# Patient Record
Sex: Female | Born: 1960
Health system: Southern US, Community
[De-identification: ages and names within clinical notes are randomized; demographics above are authoritative.]

## PROBLEM LIST (undated history)

## (undated) DIAGNOSIS — T7840XA Allergy, unspecified, initial encounter: Secondary | ICD-10-CM

## (undated) HISTORY — PX: CRYOABLATION: SHX1415

## (undated) HISTORY — DX: Allergy, unspecified, initial encounter: T78.40XA

---

## 2018-02-27 ENCOUNTER — Emergency Department (HOSPITAL_COMMUNITY): Payer: Self-pay

## 2018-02-27 ENCOUNTER — Encounter (HOSPITAL_COMMUNITY): Payer: Self-pay

## 2018-02-27 ENCOUNTER — Other Ambulatory Visit: Payer: Self-pay

## 2018-02-27 ENCOUNTER — Inpatient Hospital Stay (HOSPITAL_COMMUNITY)
Admission: EM | Admit: 2018-02-27 | Discharge: 2018-03-01 | DRG: 494 | Disposition: A | Discharge status: Home Health | Payer: Self-pay | Attending: Orthopedic Surgery | Admitting: Orthopedic Surgery

## 2018-02-27 ENCOUNTER — Inpatient Hospital Stay (HOSPITAL_COMMUNITY): Payer: Self-pay

## 2018-02-27 ENCOUNTER — Telehealth: Payer: Self-pay | Admitting: Radiology

## 2018-02-27 DIAGNOSIS — Z9889 Other specified postprocedural states: Secondary | ICD-10-CM

## 2018-02-27 DIAGNOSIS — S82852A Displaced trimalleolar fracture of left lower leg, initial encounter for closed fracture: Principal | ICD-10-CM | POA: Diagnosis present

## 2018-02-27 DIAGNOSIS — F17210 Nicotine dependence, cigarettes, uncomplicated: Secondary | ICD-10-CM | POA: Diagnosis present

## 2018-02-27 DIAGNOSIS — W109XXA Fall (on) (from) unspecified stairs and steps, initial encounter: Secondary | ICD-10-CM | POA: Diagnosis present

## 2018-02-27 DIAGNOSIS — Z01818 Encounter for other preprocedural examination: Secondary | ICD-10-CM

## 2018-02-27 LAB — CBC WITH DIFFERENTIAL/PLATELET
BASOS ABS: 0 10*3/uL (ref 0.0–0.1)
BASOS PCT: 0 %
EOS ABS: 0.1 10*3/uL (ref 0.0–0.7)
EOS PCT: 1 %
HCT: 39.6 % (ref 36.0–46.0)
HEMOGLOBIN: 13.6 g/dL (ref 12.0–15.0)
LYMPHS ABS: 1.6 10*3/uL (ref 0.7–4.0)
Lymphocytes Relative: 22 %
MCH: 32.5 pg (ref 26.0–34.0)
MCHC: 34.3 g/dL (ref 30.0–36.0)
MCV: 94.5 fL (ref 78.0–100.0)
Monocytes Absolute: 0.5 10*3/uL (ref 0.1–1.0)
Monocytes Relative: 6 %
NEUTROS PCT: 71 %
Neutro Abs: 5.1 10*3/uL (ref 1.7–7.7)
PLATELETS: 205 10*3/uL (ref 150–400)
RBC: 4.19 MIL/uL (ref 3.87–5.11)
RDW: 12.5 % (ref 11.5–15.5)
WBC: 7.2 10*3/uL (ref 4.0–10.5)

## 2018-02-27 LAB — BASIC METABOLIC PANEL
ANION GAP: 12 (ref 5–15)
BUN: 11 mg/dL (ref 6–20)
CHLORIDE: 101 mmol/L (ref 101–111)
CO2: 26 mmol/L (ref 22–32)
Calcium: 9.2 mg/dL (ref 8.9–10.3)
Creatinine, Ser: 0.64 mg/dL (ref 0.44–1.00)
Glucose, Bld: 116 mg/dL — ABNORMAL HIGH (ref 65–99)
POTASSIUM: 3.6 mmol/L (ref 3.5–5.1)
SODIUM: 139 mmol/L (ref 135–145)

## 2018-02-27 LAB — SURGICAL PCR SCREEN
MRSA, PCR: NEGATIVE
STAPHYLOCOCCUS AUREUS: NEGATIVE

## 2018-02-27 MED ORDER — HYDROMORPHONE HCL 1 MG/ML IJ SOLN
0.5000 mg | Freq: Once | INTRAMUSCULAR | Status: AC
  Administered 2018-02-27: 0.5 mg via INTRAVENOUS
  Filled 2018-02-27: qty 1

## 2018-02-27 MED ORDER — HYDROCODONE-ACETAMINOPHEN 5-325 MG PO TABS
1.0000 | ORAL_TABLET | ORAL | Status: DC | PRN
  Administered 2018-02-27 – 2018-02-28 (×2): 1 via ORAL
  Filled 2018-02-27 (×2): qty 1

## 2018-02-27 MED ORDER — PROPOFOL 10 MG/ML IV BOLUS
100.0000 mg | Freq: Once | INTRAVENOUS | Status: AC
  Administered 2018-02-27: 40 mg via INTRAVENOUS
  Filled 2018-02-27: qty 20

## 2018-02-27 MED ORDER — SALINE SPRAY 0.65 % NA SOLN: 1.0000 | NASAL | Status: DC | PRN

## 2018-02-27 MED ORDER — SODIUM CHLORIDE 0.9 % IV SOLN
INTRAVENOUS | Status: DC
  Administered 2018-02-27 – 2018-03-01 (×4): via INTRAVENOUS

## 2018-02-27 MED ORDER — ONDANSETRON HCL 4 MG/2ML IJ SOLN
4.0000 mg | Freq: Four times a day (QID) | INTRAMUSCULAR | Status: DC | PRN
  Administered 2018-02-27: 4 mg via INTRAVENOUS
  Filled 2018-02-27: qty 2

## 2018-02-27 MED ORDER — CHLORHEXIDINE GLUCONATE 4 % EX LIQD: 60.0000 mL | Freq: Once | CUTANEOUS | Status: DC

## 2018-02-27 MED ORDER — CHLORHEXIDINE GLUCONATE 4 % EX LIQD
60.0000 mL | Freq: Once | CUTANEOUS | Status: AC
  Administered 2018-02-28: 4 via TOPICAL
  Filled 2018-02-27: qty 60

## 2018-02-27 MED ORDER — HYDROMORPHONE HCL 1 MG/ML IJ SOLN: 0.5000 mg | INTRAMUSCULAR | Status: DC | PRN

## 2018-02-27 MED ORDER — PROPOFOL 10 MG/ML IV BOLUS
INTRAVENOUS | Status: AC | PRN
  Administered 2018-02-27: 20 mg via INTRAVENOUS
  Administered 2018-02-27 (×2): 40 mg via INTRAVENOUS

## 2018-02-27 MED ORDER — HYDROMORPHONE HCL 1 MG/ML IJ SOLN
0.5000 mg | INTRAMUSCULAR | Status: DC | PRN
  Administered 2018-02-27: 1 mg via INTRAVENOUS
  Administered 2018-02-27 (×3): 0.5 mg via INTRAVENOUS
  Administered 2018-02-28 (×2): 1 mg via INTRAVENOUS
  Filled 2018-02-27 (×3): qty 1
  Filled 2018-02-27 (×3): qty 0.5

## 2018-02-27 MED ORDER — SALINE SPRAY 0.65 % NA SOLN
1.0000 | NASAL | Status: DC | PRN
  Administered 2018-02-27: 1 via NASAL
  Filled 2018-02-27: qty 44

## 2018-02-27 NOTE — ED Provider Notes (Signed)
[facility] EMERGENCY DEPARTMENT Provider Note   CSN: [id number] Arrival date & time: 02/27/18  4098     History   Chief Complaint Chief Complaint  Patient presents with  . Ankle Pain    HPI Courtney Fitzgerald is a 57 y.o. female.  HPI Patient was in normal state of health when she woke this morning.  She was going down some stairs at her house and missed the last 2 steps.  She inverted her left ankle and heard a pop.  She then fell onto her right side.  She denies hitting her head.  No loss of consciousness.  Unable to bear weight.  Currently complains of left ankle, left knee and left hip pain.  EMS transported to the emergency department.  Patient was given 75 mcg of fentanyl en route. History reviewed. No pertinent past medical history.  Patient Active Problem List   Diagnosis Date Noted  . Trimalleolar fracture of ankle, closed, left, initial encounter 02/27/2018    History reviewed. No pertinent surgical history.   OB History   None      Home Medications    Prior to Admission medications   Medication Sig Start Date End Date Taking? Authorizing Provider  sodium chloride (OCEAN) 0.65 % SOLN nasal spray Place 1 spray into both nostrils as needed for congestion.   Yes [provider]    Family History No family history on file.  Social History Social History   Tobacco Use  . Smoking status: Current Every Day Smoker    Packs/day: 1.00  Substance Use Topics  . Alcohol use: Yes    Comment: daily  . Drug use: Not Currently     Allergies   Patient has no known allergies.   Review of Systems Review of Systems  Musculoskeletal: Positive for arthralgias. Negative for back pain and neck pain.  Skin: Negative for wound.  Neurological: Negative for syncope, weakness, numbness and headaches.  All other systems reviewed and are negative.    Physical Exam Updated Vital Signs BP (!) 184/91   Pulse 83   Temp 97.8 F (36.6 C) (Oral)   Resp 19   Ht 5\' 7"   (1.702 m)   Wt 93 kg (205 lb)   SpO2 96%   BMI 32.11 kg/m   Physical Exam  Constitutional: She is oriented to person, place, and time. She appears well-developed and well-nourished.  HENT:  Head: Normocephalic and atraumatic.  Mouth/Throat: Oropharynx is clear and moist.  No obvious scalp injury.  Midface is stable.  No malocclusion.  Eyes: Pupils are equal, round, and reactive to light. EOM are normal.  Neck: Normal range of motion. Neck supple.  No posterior midline cervical tenderness to palpation.  Cardiovascular: Normal rate and regular rhythm.  Pulmonary/Chest: Effort normal and breath sounds normal.  Abdominal: Soft. Bowel sounds are normal. There is no tenderness. There is no rebound and no guarding.  Musculoskeletal: Normal range of motion. She exhibits no edema or tenderness.  Pelvis is stable.  She does have some range of pain with range of motion of the left hip.  Tenderness to palpation over the lateral posterior left knee.  No obvious deformity or effusion.  Limited range of motion due to pain.  Left ankle is diffusely swollen and tender.  Limited range of motion due to pain.  Distal pulses are 2+.  Good distal cap refill.  Neurological: She is alert and oriented to person, place, and time.  Distal sensation to light touch intact.  Patient able to wiggle toes without deficit.  Skin: Skin is warm and dry. Capillary refill takes less than 2 seconds. No rash noted. No erythema.  Psychiatric: She has a normal mood and affect. Her behavior is normal.  Nursing note and vitals reviewed.    ED Treatments / Results  Labs (all labs ordered are listed, but only abnormal results are displayed) Labs Reviewed - No data to display  EKG None  Radiology Dg Ankle Complete Left  Result Date: 02/27/2018 CLINICAL DATA:  Post reduction of left ankle fracture. EXAM: LEFT ANKLE COMPLETE - 3+ VIEW COMPARISON:  Left ankle radiographs of the same day at 7:22 a.m. FINDINGS: Bimalleolar  fractures are in a similar configuration to previous study. The posterior tibial fracture demonstrates slight increased displacement. A fiberglass splint is now in place. No new fractures are present. IMPRESSION: 1. Interval placement of fiberglass splint without significant reduction in the fractures. Electronically Signed   By: Marin Roberts M.D.   On: 02/27/2018 09:46   Dg Ankle Complete Left  Result Date: 02/27/2018 CLINICAL DATA:  Pain following fall EXAM: LEFT ANKLE COMPLETE - 3+ VIEW COMPARISON:  None. FINDINGS: Frontal, oblique, and lateral views were obtained. There is a transversely oriented fracture of the medial malleolus with medial malleolus displaced laterally with respect the remainder the tibia. There is a comminuted fracture of the distal fibular diaphysis with lateral displacement and anterior angulation distally. There is a fracture of the posterior tibial metaphysis with alignment near anatomic. There is ankle mortise disruption. There are spurs arising from the posterior inferior calcaneus. No appreciable joint space narrowing. IMPRESSION: Fractures of the medial malleolus, distal fibula, and posterior tibial metaphysis consistent with trimalleolar type fracture. Ankle mortise disruption noted. No appreciable joint space narrowing.  There are calcaneal spurs. Electronically Signed   By: Bretta Bang III M.D.   On: 02/27/2018 08:17   Dg Knee Complete 4 Views Left  Result Date: 02/27/2018 CLINICAL DATA:  Pain following fall EXAM: LEFT KNEE - COMPLETE 4+ VIEW COMPARISON:  None. FINDINGS: Frontal, lateral, and bilateral oblique views were obtained. There is no evident fracture or dislocation. No joint effusion. Joint spaces appear normal. No erosive change. IMPRESSION: No fracture or joint effusion.  No evident arthropathy. Electronically Signed   By: Bretta Bang III M.D.   On: 02/27/2018 08:17   Dg Hip Unilat W Or Wo Pelvis 2-3 Views Left  Result Date:  02/27/2018 CLINICAL DATA:  Pain following fall EXAM: DG HIP (WITH OR WITHOUT PELVIS) 2-3V LEFT COMPARISON:  None. FINDINGS: Frontal pelvis as well as frontal and lateral left hip images were obtained. There is no fracture or dislocation. Joint spaces appear normal. No erosive change. IMPRESSION: No fracture or dislocation.  No evident arthropathy. Electronically Signed   By: Bretta Bang III M.D.   On: 02/27/2018 08:15    Procedures Reduction of fracture Date/Time: 02/27/2018 8:42 AM Performed by: Loren Racer, MD Authorized by: Loren Racer, MD  Consent: Written consent obtained. Risks and benefits: risks, benefits and alternatives were discussed Consent given by: patient Patient understanding: patient states understanding of the procedure being performed Patient consent: the patient's understanding of the procedure matches consent given Procedure consent: procedure consent matches procedure scheduled Relevant documents: relevant documents present and verified Imaging studies: imaging studies available Patient identity confirmed: verbally with patient and arm band Time out: Immediately prior to procedure a "time out" was called to verify the correct patient, procedure, equipment, support staff and site/side marked as required.  Local anesthesia used: no  Anesthesia: Local anesthesia used: no  Sedation: Patient sedated: yes Sedatives: propofol Sedation start date/time: 02/27/2018 8:42 AM Sedation end date/time: 02/27/2018 8:55 AM Vitals: Vital signs were monitored during sedation.  Patient tolerance: Patient tolerated the procedure well with no immediate complications  .Splint Application Date/Time: 02/27/2018 8:58 AM Performed by: Loren RacerYelverton, Delford Wingert, MD Authorized by: Loren RacerYelverton, Haik Mahoney, MD   Consent:    Consent obtained:  Written   Consent given by:  Patient   Risks discussed:  Numbness, pain and swelling Pre-procedure details:    Sensation:  Normal Procedure details:     Laterality:  Left   Location:  Leg   Leg:  L lower leg   Splint type:  Short leg and ankle stirrup   Supplies:  Prefabricated splint Post-procedure details:    Sensation:  Normal   Patient tolerance of procedure:  Tolerated well, no immediate complications .Sedation Date/Time: 02/27/2018 8:42 AM Performed by: Loren RacerYelverton, Dalis Beers, MD Authorized by: Loren RacerYelverton, Brittni Hult, MD   Consent:    Consent obtained:  Written   Consent given by:  Patient   Risks discussed:  Prolonged sedation necessitating reversal, prolonged hypoxia resulting in organ damage, respiratory compromise necessitating ventilatory assistance and intubation, vomiting, nausea, inadequate sedation and allergic reaction Indications:    Procedure performed:  Fracture reduction   Procedure necessitating sedation performed by:  Physician performing sedation   Intended level of sedation:  Moderate (conscious sedation) Pre-sedation assessment:    Time since last food or drink:  11 hours   ASA classification: class 1 - normal, healthy patient     Neck mobility: normal     Mouth opening:  3 or more finger widths   Mallampati score:  I - soft palate, uvula, fauces, pillars visible   Pre-sedation assessments completed and reviewed: airway patency, cardiovascular function, hydration status, mental status, nausea/vomiting, pain level and respiratory function     Pre-sedation assessment completed:  02/27/2018 9:40 AM Immediate pre-procedure details:    Reassessment: Patient reassessed immediately prior to procedure     Reviewed: vital signs     Verified: bag valve mask available, emergency equipment available, intubation equipment available, oxygen available and suction available   Procedure details (see MAR for exact dosages):    Preoxygenation:  Nasal cannula   Sedation:  Propofol   Intra-procedure monitoring:  Blood pressure monitoring, cardiac monitor, continuous pulse oximetry, frequent LOC assessments and frequent vital sign checks    Intra-procedure events: none     Total Provider sedation time (minutes):  13 Post-procedure details:    Post-sedation assessment completed:  02/27/2018 9:02 AM   Attendance: Constant attendance by certified staff until patient recovered     Recovery: Patient returned to pre-procedure baseline     Post-sedation assessments completed and reviewed: airway patency, cardiovascular function, hydration status, mental status, nausea/vomiting, pain level and respiratory function     Patient tolerance:  Tolerated well, no immediate complications   (including critical care time)  Medications Ordered in ED Medications  HYDROmorphone (DILAUDID) injection 0.5 mg (has no administration in time range)  HYDROmorphone (DILAUDID) injection 0.5 mg (has no administration in time range)  propofol (DIPRIVAN) 10 mg/mL bolus/IV push 100 mg (40 mg Intravenous Given 02/27/18 0843)  propofol (DIPRIVAN) 10 mg/mL bolus/IV push (20 mg Intravenous Given 02/27/18 0852)     Initial Impression / Assessment and Plan / ED Course  I have reviewed the triage vital signs and the nursing notes.  Pertinent labs & imaging results that were available  during my care of the patient were reviewed by me and considered in my medical decision making (see chart for details).     Patient with trimalleolar fracture of the left ankle.  Procedural sedation performed in the emergency department with reduction and splint application.  For reduction of dry mouth fracture.  Discussed with Dr. Romeo Apple.  Will admit to MedSurg bed. Final Clinical Impressions(s) / ED Diagnoses   Final diagnoses:  Closed trimalleolar fracture of left ankle, initial encounter    ED Discharge Orders    None       Loren Racer, MD 02/27/18 1029

## 2018-02-27 NOTE — ED Triage Notes (Addendum)
Pt reports that she was going down steps this morning and missed 2 steps . Pt reports she heard left ankle snap. Pt given fentanyl 75 mcg IV. Noted deformity. Foot warm, pulse palpable

## 2018-02-27 NOTE — Sedation Documentation (Signed)
MD wrapping pt's ankle

## 2018-02-27 NOTE — H&P (Signed)
Courtney Fitzgerald is an 57 y.o. female.   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  Closed reduction was attempted unsuccessful patient was admitted for definitive management  Complains of moderate to severe pain only on the left ankle complains of some throbbing aching in the left ankle. Cant bear weight   Denies any other joint pain  No history of hypertension or diabetes  No other surgery only anesthetic was for birth of 2 children  History reviewed. No pertinent past medical history.  History reviewed. No pertinent surgical history.  History reviewed. No pertinent family history. Social History:  reports that she has been smoking.  She has been smoking about 1.00 pack per day. She uses smokeless tobacco. She reports that she drinks alcohol. She reports that she has current or past drug history.  Allergies: No Known Allergies  Medications Prior to Admission  Medication Sig Dispense Refill  . sodium chloride (OCEAN) 0.65 % SOLN nasal spray Place 1 spray into both nostrils as needed for congestion.      No results found for this or any previous visit (from the past 48 hour(s)). Dg Ankle Complete Left  Result Date: 02/27/2018 CLINICAL DATA:  Post reduction of left ankle fracture. EXAM: LEFT ANKLE COMPLETE - 3+ VIEW COMPARISON:  Left ankle radiographs of the same day at 7:22 a.m. FINDINGS: Bimalleolar fractures are in a similar configuration to previous study. The posterior tibial fracture demonstrates slight increased displacement. A fiberglass splint is now in place. No new fractures are present. IMPRESSION: 1. Interval placement of fiberglass splint without significant reduction in the fractures. Electronically Signed   By: Marin Roberts M.D.   On: 02/27/2018 09:46   Dg Ankle Complete Left  Result Date: 02/27/2018 CLINICAL DATA:  Pain following fall  EXAM: LEFT ANKLE COMPLETE - 3+ VIEW COMPARISON:  None. FINDINGS: Frontal, oblique, and lateral views were obtained. There is a transversely oriented fracture of the medial malleolus with medial malleolus displaced laterally with respect the remainder the tibia. There is a comminuted fracture of the distal fibular diaphysis with lateral displacement and anterior angulation distally. There is a fracture of the posterior tibial metaphysis with alignment near anatomic. There is ankle mortise disruption. There are spurs arising from the posterior inferior calcaneus. No appreciable joint space narrowing. IMPRESSION: Fractures of the medial malleolus, distal fibula, and posterior tibial metaphysis consistent with trimalleolar type fracture. Ankle mortise disruption noted. No appreciable joint space narrowing.  There are calcaneal spurs. Electronically Signed   By: Bretta Bang III M.D.   On: 02/27/2018 08:17   Dg Knee Complete 4 Views Left  Result Date: 02/27/2018 CLINICAL DATA:  Pain following fall EXAM: LEFT KNEE - COMPLETE 4+ VIEW COMPARISON:  None. FINDINGS: Frontal, lateral, and bilateral oblique views were obtained. There is no evident fracture or dislocation. No joint effusion. Joint spaces appear normal. No erosive change. IMPRESSION: No fracture or joint effusion.  No evident arthropathy. Electronically Signed   By: Bretta Bang III M.D.   On: 02/27/2018 08:17   Dg Hip Unilat W Or Wo Pelvis 2-3 Views Left  Result Date: 02/27/2018 CLINICAL DATA:  Pain following fall EXAM: DG HIP (WITH OR WITHOUT PELVIS) 2-3V LEFT COMPARISON:  None. FINDINGS: Frontal pelvis as well as frontal and lateral left hip images were obtained. There is no fracture or dislocation. Joint spaces appear normal. No erosive change.  IMPRESSION: No fracture or dislocation.  No evident arthropathy. Electronically Signed   By: Bretta BangWilliam  Woodruff III M.D.   On: 02/27/2018 08:15    Review of Systems  Respiratory: Negative for  shortness of breath.   All other systems reviewed and are negative.   Blood pressure (!) 173/88, pulse 72, temperature 97.9 F (36.6 C), temperature source Oral, resp. rate 18, height 5\' 7"  (1.702 m), weight 192 lb 10.9 oz (87.4 kg), SpO2 98 %. Physical Exam  Nursing note and vitals reviewed. Constitutional: She is oriented to person, place, and time. She appears well-nourished.  Eyes: Right eye exhibits no discharge. Left eye exhibits no discharge. No scleral icterus.  Neck: Neck supple. No JVD present. No tracheal deviation present.  Cardiovascular: Intact distal pulses.  Respiratory: Effort normal. No stridor.  GI: Soft. She exhibits no distension.  Musculoskeletal:       Arms:      Legs: Neurological: She is alert and oriented to person, place, and time. She has normal reflexes. She exhibits normal muscle tone. Coordination normal.  Skin: Skin is warm and dry. No rash noted. No erythema. No pallor.  Psychiatric: She has a normal mood and affect. Her behavior is normal. Thought content normal.     Assessment/Plan X-ray shows a comminuted fracture of the lateral malleolus transverse fracture of the medial malleolus posterior malleolus fracture approximately 10% of the joint surface  The procedure has been fully reviewed with the patient; The risks and benefits of surgery have been discussed and explained and understood. Alternative treatment has also been reviewed, questions were encouraged and answered. The postoperative plan is also been reviewed.  The nature of the problems regarding the left ankle fracture been discussed including the postoperative risks and benefits of surgery including but not limited to stiffness of the ankle, subsequent hardware removal, infection, blood clot,  Patient has been warned about the smoking effect on wound healing and bone healing  Plan is to perform open treatment internal fixation left ankle with plate and screws    Fuller CanadaStanley Mateo Overbeck,  MD 02/27/2018, 1:06 PM

## 2018-02-27 NOTE — ED Notes (Signed)
Patient transported to X-ray 

## 2018-02-27 NOTE — Telephone Encounter (Signed)
Nurse Leotis ShamesLauren called, wants you to know patient is in Room 306 with ankle fracture.

## 2018-02-28 ENCOUNTER — Encounter (HOSPITAL_COMMUNITY): Payer: Self-pay

## 2018-02-28 ENCOUNTER — Inpatient Hospital Stay (HOSPITAL_COMMUNITY): Payer: Self-pay | Admitting: Anesthesiology

## 2018-02-28 ENCOUNTER — Encounter (HOSPITAL_COMMUNITY)
Admission: EM | Disposition: A | Discharge status: Home Health | Payer: Self-pay | Source: Home / Self Care | Attending: Orthopedic Surgery

## 2018-02-28 ENCOUNTER — Inpatient Hospital Stay (HOSPITAL_COMMUNITY): Payer: Self-pay

## 2018-02-28 HISTORY — PX: ORIF ANKLE FRACTURE: SHX5408

## 2018-02-28 SURGERY — OPEN REDUCTION INTERNAL FIXATION (ORIF) ANKLE FRACTURE
Anesthesia: Regional | Laterality: Left

## 2018-02-28 MED ORDER — ONDANSETRON HCL 4 MG/2ML IJ SOLN
4.0000 mg | Freq: Four times a day (QID) | INTRAMUSCULAR | Status: DC | PRN
Start: 2018-02-28 — End: 2018-03-01

## 2018-02-28 MED ORDER — FENTANYL CITRATE (PF) 100 MCG/2ML IJ SOLN
INTRAMUSCULAR | Status: DC | PRN
  Administered 2018-02-28: 25 ug via INTRAVENOUS

## 2018-02-28 MED ORDER — MEPERIDINE HCL 50 MG/ML IJ SOLN: 6.2500 mg | INTRAMUSCULAR | Status: DC | PRN

## 2018-02-28 MED ORDER — ONDANSETRON HCL 4 MG PO TABS: 4.0000 mg | ORAL_TABLET | Freq: Four times a day (QID) | ORAL | Status: DC | PRN

## 2018-02-28 MED ORDER — TRAMADOL HCL 50 MG PO TABS
50.0000 mg | ORAL_TABLET | Freq: Four times a day (QID) | ORAL | Status: DC
  Administered 2018-02-28 – 2018-03-01 (×4): 50 mg via ORAL
  Filled 2018-02-28 (×4): qty 1

## 2018-02-28 MED ORDER — CEFAZOLIN SODIUM-DEXTROSE 2-4 GM/100ML-% IV SOLN
2.0000 g | INTRAVENOUS | Status: AC
  Administered 2018-02-28: 2 g via INTRAVENOUS

## 2018-02-28 MED ORDER — ONDANSETRON HCL 4 MG/2ML IJ SOLN: 4.0000 mg | Freq: Once | INTRAMUSCULAR | Status: DC | PRN

## 2018-02-28 MED ORDER — CEFAZOLIN SODIUM-DEXTROSE 2-4 GM/100ML-% IV SOLN
2.0000 g | Freq: Four times a day (QID) | INTRAVENOUS | Status: AC
  Administered 2018-02-28 – 2018-03-01 (×2): 2 g via INTRAVENOUS
  Filled 2018-02-28 (×2): qty 100

## 2018-02-28 MED ORDER — EPHEDRINE SULFATE 50 MG/ML IJ SOLN: INTRAMUSCULAR | Status: DC | PRN

## 2018-02-28 MED ORDER — LACTATED RINGERS IV SOLN
INTRAVENOUS | Status: DC | PRN
  Administered 2018-02-28 (×2): via INTRAVENOUS

## 2018-02-28 MED ORDER — 0.9 % SODIUM CHLORIDE (POUR BTL) OPTIME
TOPICAL | Status: DC | PRN
  Administered 2018-02-28 (×2): 1000 mL

## 2018-02-28 MED ORDER — CEFAZOLIN SODIUM-DEXTROSE 2-4 GM/100ML-% IV SOLN
INTRAVENOUS | Status: AC
  Filled 2018-02-28: qty 100

## 2018-02-28 MED ORDER — MENTHOL 3 MG MT LOZG
1.0000 | LOZENGE | OROMUCOSAL | Status: DC | PRN
Start: 2018-02-28 — End: 2018-03-01

## 2018-02-28 MED ORDER — HYDROCODONE-ACETAMINOPHEN 7.5-325 MG PO TABS: 1.0000 | ORAL_TABLET | Freq: Once | ORAL | Status: DC | PRN

## 2018-02-28 MED ORDER — DEXAMETHASONE SODIUM PHOSPHATE 4 MG/ML IJ SOLN
INTRAMUSCULAR | Status: AC
  Filled 2018-02-28: qty 2

## 2018-02-28 MED ORDER — BUPIVACAINE-EPINEPHRINE (PF) 0.5% -1:200000 IJ SOLN
INTRAMUSCULAR | Status: AC
  Filled 2018-02-28: qty 60

## 2018-02-28 MED ORDER — BUPIVACAINE-EPINEPHRINE (PF) 0.5% -1:200000 IJ SOLN
INTRAMUSCULAR | Status: DC | PRN
  Administered 2018-02-28: 45 mL via PERINEURAL

## 2018-02-28 MED ORDER — PROPOFOL 10 MG/ML IV BOLUS
INTRAVENOUS | Status: DC | PRN
  Administered 2018-02-28: 200 mg via INTRAVENOUS

## 2018-02-28 MED ORDER — GABAPENTIN 300 MG PO CAPS
300.0000 mg | ORAL_CAPSULE | Freq: Three times a day (TID) | ORAL | Status: DC
  Administered 2018-02-28 – 2018-03-01 (×2): 300 mg via ORAL
  Filled 2018-02-28 (×2): qty 1

## 2018-02-28 MED ORDER — HYDROMORPHONE HCL 1 MG/ML IJ SOLN: 0.2500 mg | INTRAMUSCULAR | Status: DC | PRN

## 2018-02-28 MED ORDER — METOCLOPRAMIDE HCL 5 MG/ML IJ SOLN: 5.0000 mg | Freq: Three times a day (TID) | INTRAMUSCULAR | Status: DC | PRN

## 2018-02-28 MED ORDER — HYDROCODONE-ACETAMINOPHEN 5-325 MG PO TABS
1.0000 | ORAL_TABLET | ORAL | Status: DC | PRN
  Administered 2018-02-28: 2 via ORAL
  Filled 2018-02-28: qty 2

## 2018-02-28 MED ORDER — PHENOL 1.4 % MT LIQD: 1.0000 | OROMUCOSAL | Status: DC | PRN

## 2018-02-28 MED ORDER — KETOROLAC TROMETHAMINE 15 MG/ML IJ SOLN
7.5000 mg | Freq: Four times a day (QID) | INTRAMUSCULAR | Status: AC
  Administered 2018-02-28 – 2018-03-01 (×4): 7.5 mg via INTRAVENOUS
  Filled 2018-02-28 (×4): qty 1

## 2018-02-28 MED ORDER — FENTANYL CITRATE (PF) 100 MCG/2ML IJ SOLN
INTRAMUSCULAR | Status: AC
  Filled 2018-02-28: qty 4

## 2018-02-28 MED ORDER — ACETAMINOPHEN 325 MG PO TABS: 325.0000 mg | ORAL_TABLET | Freq: Four times a day (QID) | ORAL | Status: DC | PRN

## 2018-02-28 MED ORDER — PROPOFOL 10 MG/ML IV BOLUS
INTRAVENOUS | Status: AC
  Filled 2018-02-28: qty 20

## 2018-02-28 MED ORDER — MIDAZOLAM HCL 5 MG/5ML IJ SOLN
INTRAMUSCULAR | Status: DC | PRN
  Administered 2018-02-28: 1 mg via INTRAVENOUS
  Administered 2018-02-28: 5 mg via INTRAVENOUS

## 2018-02-28 MED ORDER — DOCUSATE SODIUM 100 MG PO CAPS
100.0000 mg | ORAL_CAPSULE | Freq: Two times a day (BID) | ORAL | Status: DC
  Administered 2018-02-28 – 2018-03-01 (×2): 100 mg via ORAL
  Filled 2018-02-28 (×2): qty 1

## 2018-02-28 MED ORDER — POVIDONE-IODINE 10 % EX SWAB: 2.0000 "application " | Freq: Once | CUTANEOUS | Status: DC

## 2018-02-28 MED ORDER — DEXAMETHASONE SODIUM PHOSPHATE 4 MG/ML IJ SOLN
INTRAMUSCULAR | Status: DC | PRN
  Administered 2018-02-28: 8 mg

## 2018-02-28 MED ORDER — ENOXAPARIN SODIUM 30 MG/0.3ML ~~LOC~~ SOLN
30.0000 mg | SUBCUTANEOUS | Status: DC
Start: 2018-03-01 — End: 2018-02-28

## 2018-02-28 MED ORDER — ENOXAPARIN SODIUM 40 MG/0.4ML ~~LOC~~ SOLN
40.0000 mg | SUBCUTANEOUS | Status: DC
  Administered 2018-03-01: 40 mg via SUBCUTANEOUS
  Filled 2018-02-28: qty 0.4

## 2018-02-28 MED ORDER — HYDROCODONE-ACETAMINOPHEN 7.5-325 MG PO TABS: 1.0000 | ORAL_TABLET | ORAL | Status: DC | PRN

## 2018-02-28 MED ORDER — ONDANSETRON HCL 4 MG/2ML IJ SOLN
INTRAMUSCULAR | Status: DC | PRN
  Administered 2018-02-28: 4 mg via INTRAVENOUS

## 2018-02-28 MED ORDER — SODIUM CHLORIDE 0.9 % IJ SOLN
INTRAMUSCULAR | Status: AC
  Filled 2018-02-28: qty 10

## 2018-02-28 MED ORDER — MIDAZOLAM HCL 2 MG/2ML IJ SOLN
INTRAMUSCULAR | Status: AC
  Filled 2018-02-28: qty 6

## 2018-02-28 MED ORDER — SODIUM CHLORIDE 0.9 % IV SOLN
INTRAVENOUS | Status: DC
  Administered 2018-02-28: 18:00:00 via INTRAVENOUS

## 2018-02-28 MED ORDER — MIDAZOLAM HCL 2 MG/2ML IJ SOLN
INTRAMUSCULAR | Status: AC
  Filled 2018-02-28: qty 2

## 2018-02-28 MED ORDER — MORPHINE SULFATE (PF) 2 MG/ML IV SOLN: 0.5000 mg | INTRAVENOUS | Status: DC | PRN

## 2018-02-28 MED ORDER — KETOROLAC TROMETHAMINE 30 MG/ML IJ SOLN: 30.0000 mg | Freq: Once | INTRAMUSCULAR | Status: DC | PRN

## 2018-02-28 MED ORDER — ROPIVACAINE HCL 5 MG/ML IJ SOLN
INTRAMUSCULAR | Status: DC | PRN
  Administered 2018-02-28: 30 mL

## 2018-02-28 MED ORDER — ROPIVACAINE HCL 5 MG/ML IJ SOLN
INTRAMUSCULAR | Status: AC
Start: 2018-02-28 — End: 2018-02-28
  Filled 2018-02-28: qty 30

## 2018-02-28 MED ORDER — METOCLOPRAMIDE HCL 10 MG PO TABS: 5.0000 mg | ORAL_TABLET | Freq: Three times a day (TID) | ORAL | Status: DC | PRN

## 2018-02-28 MED ORDER — CELECOXIB 100 MG PO CAPS
200.0000 mg | ORAL_CAPSULE | Freq: Two times a day (BID) | ORAL | Status: DC
  Administered 2018-02-28 – 2018-03-01 (×2): 200 mg via ORAL
  Filled 2018-02-28 (×2): qty 2

## 2018-02-28 MED ORDER — ONDANSETRON HCL 4 MG/2ML IJ SOLN
INTRAMUSCULAR | Status: AC
  Filled 2018-02-28: qty 2

## 2018-02-28 MED ORDER — LIDOCAINE HCL 1 % IJ SOLN
INTRAMUSCULAR | Status: DC | PRN
  Administered 2018-02-28: 100 mg via INTRADERMAL

## 2018-02-28 SURGICAL SUPPLY — 65 items
1.6 k-wire ×6 IMPLANT
BANDAGE ELASTIC 3 LF NS (GAUZE/BANDAGES/DRESSINGS) ×3 IMPLANT
BANDAGE ELASTIC 4 LF NS (GAUZE/BANDAGES/DRESSINGS) ×6 IMPLANT
BANDAGE ESMARK 4X12 BL STRL LF (DISPOSABLE) ×1 IMPLANT
BIT DRILL 3.5X122MM AO FIT (BIT) IMPLANT
BIT DRILL CANN 2.7 (BIT)
BIT DRILL CANN 2.7MM (BIT)
BIT DRILL SRG 2.7XCANN AO CPLG (BIT) IMPLANT
BIT DRL SRG 2.7XCANN AO CPLNG (BIT)
BLADE SURG SZ10 CARB STEEL (BLADE) ×3 IMPLANT
BNDG COHESIVE 4X5 TAN STRL (GAUZE/BANDAGES/DRESSINGS) ×3 IMPLANT
BNDG ESMARK 4X12 BLUE STRL LF (DISPOSABLE) ×3
CHLORAPREP W/TINT 26ML (MISCELLANEOUS) ×6 IMPLANT
CLOTH BEACON ORANGE TIMEOUT ST (SAFETY) ×3 IMPLANT
COVER LIGHT HANDLE STERIS (MISCELLANEOUS) ×6 IMPLANT
CUFF TOURNIQUET SINGLE 34IN LL (TOURNIQUET CUFF) ×3 IMPLANT
DECANTER SPIKE VIAL GLASS SM (MISCELLANEOUS) ×6 IMPLANT
DRAPE C-ARM FOLDED MOBILE STRL (DRAPES) ×3 IMPLANT
DRAPE PROXIMA HALF (DRAPES) ×3 IMPLANT
DRILL 2.6X122MM WL AO SHAFT (BIT) ×3 IMPLANT
GAUZE SPONGE 4X4 12PLY STRL (GAUZE/BANDAGES/DRESSINGS) ×6 IMPLANT
GAUZE XEROFORM 5X9 LF (GAUZE/BANDAGES/DRESSINGS) ×6 IMPLANT
GLOVE BIOGEL PI IND STRL 7.0 (GLOVE) ×2 IMPLANT
GLOVE BIOGEL PI INDICATOR 7.0 (GLOVE) ×4
GLOVE SKINSENSE NS SZ8.0 LF (GLOVE) ×2
GLOVE SKINSENSE STRL SZ8.0 LF (GLOVE) ×1 IMPLANT
GLOVE SS N UNI LF 8.5 STRL (GLOVE) ×3 IMPLANT
GOWN STRL REUS W/TWL LRG LVL3 (GOWN DISPOSABLE) ×6 IMPLANT
GOWN STRL REUS W/TWL XL LVL3 (GOWN DISPOSABLE) ×3 IMPLANT
INST SET MINOR BONE (KITS) ×3 IMPLANT
K-WIRE 1.4X100 (WIRE)
K-WIRE 1.6X150 (WIRE) ×2
K-WIRE FX150X1.6XKRSH (WIRE) ×2
K-WIRE SMOOTH 2.0X150 (WIRE)
KIT TURNOVER KIT A (KITS) ×3 IMPLANT
KWIRE 1.4X100 (WIRE) IMPLANT
KWIRE FX150X1.6XKRSH (WIRE) ×2 IMPLANT
KWIRE SMOOTH 2.0X150 (WIRE) IMPLANT
MANIFOLD NEPTUNE II (INSTRUMENTS) ×3 IMPLANT
NEEDLE HYPO 21X1.5 SAFETY (NEEDLE) ×3 IMPLANT
NS IRRIG 1000ML POUR BTL (IV SOLUTION) ×3 IMPLANT
PACK BASIC LIMB (CUSTOM PROCEDURE TRAY) ×3 IMPLANT
PAD ABD 5X9 TENDERSORB (GAUZE/BANDAGES/DRESSINGS) ×6 IMPLANT
PAD ARMBOARD 7.5X6 YLW CONV (MISCELLANEOUS) ×3 IMPLANT
PAD CAST 4YDX4 CTTN HI CHSV (CAST SUPPLIES) ×2 IMPLANT
PADDING CAST COTTON 4X4 STRL (CAST SUPPLIES) ×4
PLATE FIBULA 4H (Plate) ×3 IMPLANT
SCREW 3.5X10MM (Screw) ×3 IMPLANT
SCREW BONE 14MMX3.5MM (Screw) ×6 IMPLANT
SCREW BONE 18 (Screw) ×3 IMPLANT
SCREW BONE 3.5X20MM (Screw) ×3 IMPLANT
SCREW BONE NON-LCKING 3.5X12MM (Screw) ×3 IMPLANT
SCREW NONLOCK 22MM (Screw) ×3 IMPLANT
SET BASIN LINEN APH (SET/KITS/TRAYS/PACK) ×3 IMPLANT
SPLINT IMMOBILIZER J 3INX20FT (CAST SUPPLIES)
SPLINT J IMMOBILIZER 3X20FT (CAST SUPPLIES) IMPLANT
SPLINT J IMMOBILIZER 4X20FT (CAST SUPPLIES) ×1 IMPLANT
SPLINT J PLASTER J 4INX20Y (CAST SUPPLIES) ×2
SPONGE LAP 18X18 X RAY DECT (DISPOSABLE) ×3 IMPLANT
STAPLER VISISTAT 35W (STAPLE) ×3 IMPLANT
SUT ETHILON 3 0 FSL (SUTURE) ×3 IMPLANT
SUT MON AB 0 CT1 (SUTURE) ×3 IMPLANT
SUT MON AB 2-0 CT1 36 (SUTURE) ×3 IMPLANT
SYR 30ML LL (SYRINGE) ×3 IMPLANT
SYR BULB IRRIGATION 50ML (SYRINGE) ×3 IMPLANT

## 2018-02-28 NOTE — Brief Op Note (Signed)
02/28/2018  3:24 PM  PATIENT:  Courtney Fitzgerald  57 y.o. female  PRE-OPERATIVE DIAGNOSIS:  Trimalleolar fracture left ankle  POST-OPERATIVE DIAGNOSIS:  Trimalleolar fracture left ankle  PROCEDURE:  Procedure(s): OPEN REDUCTION INTERNAL FIXATION (ORIF) LEFT ANKLE FRACTURE (Left) trimalleolar without posterior fixation  [address]  Implants [employer] ankle solutions  One third tubular plate laterally multiple screws nonlocking  Two 1.6 mm K wires medially  Surgery done as follows Chart review of surgical site review confirmation and marking done in the preop left ankle.  Patient taken to surgery.  General anesthesia.  Sterile prep and drape left leg with tourniquet on the thigh and 5 pound sandbag left hip  Sterile prep and drape timeout  Lateral incision full-thickness skin flaps down to bone fracture identified irrigated manually reduced and held with a clamp.  Radiographs confirm reduction.  One third tubular plate applied to the fibula  X-rays confirm reduction of the mortise and the fracture.  Medial side opened in the same manner with a full-thickness skin flap down to bone fracture site identified opened irrigated debrided ankle joint debrided  I did not see any chondral lesion on the talus  Fracture help with a bone clamp x-ray confirmed reduction 2 K wires placed confirmed by x-ray  K wires retracted band and then pushed back into the bone  Final x-rays AP lateral mortise confirm reduction of fracture and ankle mortise  Both wounds were thoroughly irrigated the medial side was closed with 3-0 nylon interrupted suture lateral side closed with 0 Monocryl and staples  Field block done for the superficial peroneal nerve and the saphenous nerve with Marcaine and epinephrine  Patient also had popliteal and medial nerve block  SURGEON:  Surgeon(s) and Role:    * Vickki HearingHarrison, Stanley E, MD - Primary  PHYSICIAN ASSISTANT:   ASSISTANTS: cynthia wrenn   ANESTHESIA:   general and  block popliteal adductor canal   EBL:  25 mL   BLOOD ADMINISTERED:none  DRAINS: none   LOCAL MEDICATIONS USED:  MARCAINE     SPECIMEN:  No Specimen  DISPOSITION OF SPECIMEN:  N/A  COUNTS:  YES  TOURNIQUET:   Total Tourniquet Time Documented: Thigh (Left) - 53 minutes Total: Thigh (Left) - 53 minutes   DICTATION: .Reubin Milanragon Dictation  PLAN OF CARE: Admit to inpatient   PATIENT DISPOSITION:  PACU - hemodynamically stable.   Delay start of Pharmacological VTE agent (>24hrs) due to surgical blood loss or risk of bleeding: no

## 2018-02-28 NOTE — Op Note (Signed)
02/28/2018  3:24 PM  PATIENT:  Courtney Fitzgerald  56 y.o. female  PRE-OPERATIVE DIAGNOSIS:  Trimalleolar fracture left ankle  POST-OPERATIVE DIAGNOSIS:  Trimalleolar fracture left ankle  PROCEDURE:  Procedure(s): OPEN REDUCTION INTERNAL FIXATION (ORIF) LEFT ANKLE FRACTURE (Left) trimalleolar without posterior fixation  [address]22  Implants [employer] ankle solutions  One third tubular plate laterally multiple screws nonlocking  Two 1.6 mm K wires medially  Surgery done as follows Chart review of surgical site review confirmation and marking done in the preop left ankle.  Patient taken to surgery.  General anesthesia.  Sterile prep and drape left leg with tourniquet on the thigh and 5 pound sandbag left hip  Sterile prep and drape timeout  Lateral incision full-thickness skin flaps down to bone fracture identified irrigated manually reduced and held with a clamp.  Radiographs confirm reduction.  One third tubular plate applied to the fibula  X-rays confirm reduction of the mortise and the fracture.  Medial side opened in the same manner with a full-thickness skin flap down to bone fracture site identified opened irrigated debrided ankle joint debrided  I did not see any chondral lesion on the talus  Fracture help with a bone clamp x-ray confirmed reduction 2 K wires placed confirmed by x-ray  K wires retracted band and then pushed back into the bone  Final x-rays AP lateral mortise confirm reduction of fracture and ankle mortise  Both wounds were thoroughly irrigated the medial side was closed with 3-0 nylon interrupted suture lateral side closed with 0 Monocryl and staples  Field block done for the superficial peroneal nerve and the saphenous nerve with Marcaine and epinephrine  Patient also had popliteal and medial nerve block  SURGEON:  Surgeon(s) and Role:    * Piccola Arico E, MD - Primary  PHYSICIAN ASSISTANT:   ASSISTANTS: cynthia wrenn   ANESTHESIA:   general and  block popliteal adductor canal   EBL:  25 mL   BLOOD ADMINISTERED:none  DRAINS: none   LOCAL MEDICATIONS USED:  MARCAINE     SPECIMEN:  No Specimen  DISPOSITION OF SPECIMEN:  N/A  COUNTS:  YES  TOURNIQUET:   Total Tourniquet Time Documented: Thigh (Left) - 53 minutes Total: Thigh (Left) - 53 minutes   DICTATION: .Dragon Dictation  PLAN OF CARE: Admit to inpatient   PATIENT DISPOSITION:  PACU - hemodynamically stable.   Delay start of Pharmacological VTE agent (>24hrs) due to surgical blood loss or risk of bleeding: no  

## 2018-02-28 NOTE — Anesthesia Procedure Notes (Addendum)
Anesthesia Regional Block: Adductor canal block   Pre-Anesthetic Checklist: ,, timeout performed, Correct Patient, Correct Site, Correct Laterality, Correct Procedure, Correct Position, site marked, Risks and benefits discussed, at surgeon's request and post-op pain management   Prep: chloraprep       Needles:  Injection technique: Single-shot  Needle Type: Echogenic Stimulator Needle      Needle Gauge: 22   Needle insertion depth: 6 cm   Additional Needles:   Procedures:,,,, ultrasound used (permanent image in chart),,,,  Narrative:  Start time: 02/28/2018 1:35 PM End time: 02/28/2018 1:40 PM  Performed by: Personally  Anesthesiologist: Shona NeedlesWynn, Emalie Mcwethy M, MD  Additional Notes: BP cuff, EKG monitors applied. Sedation begun. Femoral artery palpated for location of nerve. After nerve location anesthetic injected incrementally, slowly , and after neg aspirations. Tolerated well. Ropivicaine 75mg  in 30cc (NaCl added) with decadron 4mg .  Versed for anxiolysis (see anesthesia record)

## 2018-02-28 NOTE — Anesthesia Preprocedure Evaluation (Signed)
Anesthesia Evaluation  Patient identified by MRN, date of birth, ID band Patient awake    Reviewed: Allergy & Precautions, NPO status , Patient's Chart, lab work & pertinent test results  Airway Mallampati: II  TM Distance: >3 FB Neck ROM: Full    Dental no notable dental hx. (+) Poor Dentition, Missing, Chipped, Dental Advisory Given   Pulmonary Current Smoker,    Pulmonary exam normal breath sounds clear to auscultation       Cardiovascular Normal cardiovascular exam Rhythm:Regular Rate:Normal     Neuro/Psych    GI/Hepatic (+)     substance abuse  alcohol use,   Endo/Other    Renal/GU      Musculoskeletal   Abdominal   Peds  Hematology   Anesthesia Other Findings   Reproductive/Obstetrics                             Anesthesia Physical Anesthesia Plan  ASA: II  Anesthesia Plan: General and Regional   Post-op Pain Management:    Induction: Intravenous  PONV Risk Score and Plan: Ondansetron and Dexamethasone  Airway Management Planned: LMA  Additional Equipment:   Intra-op Plan:   Post-operative Plan: Extubation in OR  Informed Consent: I have reviewed the patients History and Physical, chart, labs and discussed the procedure including the risks, benefits and alternatives for the proposed anesthesia with the patient or authorized representative who has indicated his/her understanding and acceptance.   Dental advisory given  Plan Discussed with: CRNA  Anesthesia Plan Comments:         Anesthesia Quick Evaluation

## 2018-02-28 NOTE — Interval H&P Note (Signed)
History and Physical Interval Note:  02/28/2018 1:47 PM  Courtney Fitzgerald  has presented today for surgery, with the diagnosis of Trimalleolar fracture left ankle  The various methods of treatment have been discussed with the patient and family. After consideration of risks, benefits and other options for treatment, the patient has consented to  Procedure(s): OPEN REDUCTION INTERNAL FIXATION (ORIF) ANKLE FRACTURE (Left) as a surgical intervention .  The patient's history has been reviewed, patient examined, no change in status, stable for surgery.  I have reviewed the patient's chart and labs.  Questions were answered to the patient's satisfaction.     [name]

## 2018-02-28 NOTE — Anesthesia Postprocedure Evaluation (Signed)
Anesthesia Post Note  Patient: Courtney Fitzgerald  Procedure(s) Performed: OPEN REDUCTION INTERNAL FIXATION (ORIF) LEFT ANKLE FRACTURE (Left )  Patient location during evaluation: PACU Anesthesia Type: Regional Level of consciousness: awake and alert and patient cooperative Pain management: pain level controlled Vital Signs Assessment: post-procedure vital signs reviewed and stable Respiratory status: spontaneous breathing, nonlabored ventilation and respiratory function stable Cardiovascular status: blood pressure returned to baseline Postop Assessment: no apparent nausea or vomiting Anesthetic complications: no     Last Vitals:  Vitals:   02/28/18 1530 02/28/18 1548  BP: (!) 183/88 (!) 152/79  Pulse: 98   Resp: 14   Temp:    SpO2:      Last Pain:  Vitals:   02/28/18 1548  TempSrc:   PainSc: 0-No pain                 Merleen Picazo J

## 2018-02-28 NOTE — Anesthesia Procedure Notes (Signed)
Procedure Name: LMA Insertion Date/Time: 02/28/2018 2:00 PM Performed by: Montez Moritaarter, Zorana Brockwell W, CRNA Pre-anesthesia Checklist: Patient identified, Emergency Drugs available, Suction available and Patient being monitored Patient Re-evaluated:Patient Re-evaluated prior to induction Oxygen Delivery Method: Circle system utilized Preoxygenation: Pre-oxygenation with 100% oxygen Induction Type: IV induction Ventilation: Mask ventilation without difficulty LMA: LMA inserted LMA Size: 4.0 Number of attempts: 1 Airway Equipment and Method: Bite block Placement Confirmation: positive ETCO2 and breath sounds checked- equal and bilateral Tube secured with: Tape Dental Injury: Teeth and Oropharynx as per pre-operative assessment

## 2018-02-28 NOTE — Anesthesia Procedure Notes (Addendum)
Anesthesia Regional Block: Popliteal block   Pre-Anesthetic Checklist: ,, timeout performed, Correct Patient, Correct Site, Correct Laterality, Correct Procedure, Correct Position, site marked, Risks and benefits discussed, at surgeon's request and post-op pain management  Laterality: Left  Prep: chloraprep       Needles:  Injection technique: Single-shot  Needle Type: Echogenic Stimulator Needle     Needle Length: 10cm  Needle Gauge: 22   Needle insertion depth: 6 cm   Additional Needles:   Procedures:,,,, ultrasound used (permanent image in chart),,,,  Narrative:  Start time: 02/28/2018 1:30 PM End time: 02/28/2018 1:35 PM  Performed by: Personally  Anesthesiologist: Shona NeedlesWynn, Wong Steadham M, MD  Additional Notes: BP cuff, EKG monitors applied. Sedation begun. Femoral artery palpated for location of nerve. After nerve location anesthetic injected incrementally, slowly , and after neg aspirations. Tolerated well. Ropivicaine 75mg  in 20cc total volume (NaCl added) with decadron 4mg .  Versed for anxiolysis (see anesthesia record)

## 2018-02-28 NOTE — Addendum Note (Signed)
Addendum  created 02/28/18 1704 by Despina HiddenIdacavage, Janetta Vandoren J, CRNA   Charge Capture section accepted

## 2018-02-28 NOTE — Transfer of Care (Signed)
Immediate Anesthesia Transfer of Care Note  Patient: Courtney Fitzgerald  Procedure(s) Performed: OPEN REDUCTION INTERNAL FIXATION (ORIF) LEFT ANKLE FRACTURE (Left )  Patient Location: PACU  Anesthesia Type:General  Level of Consciousness: awake and patient cooperative  Airway & Oxygen Therapy: Patient Spontanous Breathing and Patient connected to nasal cannula oxygen  Post-op Assessment: Report given to RN, Post -op Vital signs reviewed and stable and Patient moving all extremities  Post vital signs: Reviewed and stable  Last Vitals:  Vitals Value Taken Time  BP 152/88 02/28/2018  3:28 PM  Temp    Pulse 98 02/28/2018  3:29 PM  Resp 14 02/28/2018  3:29 PM  SpO2 92 % 02/28/2018  3:29 PM  Vitals shown include unvalidated device data.  Last Pain:  Vitals:   02/28/18 1258  TempSrc: Oral  PainSc: 3       Patients Stated Pain Goal: 2 (02/28/18 1258)  Complications: No apparent anesthesia complications

## 2018-03-01 ENCOUNTER — Encounter (HOSPITAL_COMMUNITY): Payer: Self-pay | Admitting: Orthopedic Surgery

## 2018-03-01 MED ORDER — HYDROCODONE-ACETAMINOPHEN 7.5-325 MG PO TABS: 1.0000 | ORAL_TABLET | ORAL | 0 refills | Status: DC | PRN

## 2018-03-01 NOTE — Care Management Note (Signed)
Case Management Note  Patient Details  Name: Tamera Puntlecia Aydin MRN: [id number] Date of Birth: 01/30/1961 ischarge Date:  03/01/18               Expected Discharge Plan:  Home w Home Health Services  In-House Referral:     Discharge planning Services  CM Consult  Post Acute Care Choice:  Home Health Choice offered to:  Patient  DME Arranged:  Walker rolling DME Agency:  [facility]  HH Arr[facility]  [facility]  [facility]  Status of Service:  Completed, signed off  If discussed at Long Length of Stay Meetings, dates discussed:    Additional Comments: Patient discharging today. No PCP or insurance. Recommended for HH PT and RW. Olegario MessierKathy of [facility] notified and will obtain orders via Epic. Spoke with Dr. Romeo AppleHarrison, he will place orders.   Jered Heiny, Chrystine OilerSharley Diane, RN 03/01/2018, 1:21 PM

## 2018-03-01 NOTE — Anesthesia Postprocedure Evaluation (Signed)
Anesthesia Post Note  Patient: Courtney Fitzgerald  Procedure(s) Performed: OPEN REDUCTION INTERNAL FIXATION (ORIF) LEFT ANKLE FRACTURE (Left )  Patient location during evaluation: Nursing Unit Anesthesia Type: Regional Level of consciousness: awake and alert and patient cooperative Pain management: pain level controlled Vital Signs Assessment: post-procedure vital signs reviewed and stable Respiratory status: spontaneous breathing, nonlabored ventilation and respiratory function stable Cardiovascular status: blood pressure returned to baseline Postop Assessment: no apparent nausea or vomiting and adequate PO intake Anesthetic complications: no     Last Vitals:  Vitals:   03/01/18 0334 03/01/18 0733  BP: (!) 156/91 (!) 171/90  Pulse: (!) 56 70  Resp: 16 18  Temp: 36.6 C 36.7 C  SpO2: 97% 97%    Last Pain:  Vitals:   03/01/18 0943  TempSrc:   PainSc: 0-No pain                 Peggi Yono J

## 2018-03-01 NOTE — Evaluation (Signed)
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  Closed reduction was attempted unsuccessful patient was admitted for definitive management    Clinical Impression  Patient had difficulty using axillary crutches due to fatiguing of UE's with 2 near falls, demonstrated 4 point gait pattern with crutches, safer using RW without loss of balance and tolerated sitting up in chair after therapy.  Patient will benefit from continued physical therapy in hospital and recommended venue below to increase strength, balance, endurance for safe ADLs and gait.    Follow Up Recommendations Home health PT;Supervision for mobility/OOB    Equipment Recommendations  Rolling walker with 5" wheels    Recommendations for Other Services       Precautions / Restrictions Precautions Precautions: Fall Restrictions Weight Bearing Restrictions: Yes LLE Weight Bearing: Partial weight bearing LLE Partial Weight Bearing Percentage or Pounds: 10%      Mobility  Bed Mobility Overal bed mobility: Modified Independent                Transfers Overall transfer level: Needs assistance Equipment used: Rolling walker (2 wheeled);Crutches Transfers: Sit to/from [employer] CorporationStand;Stand Pivot Transfers Sit to Stand: Supervision Stand pivot transfers: Supervision       General transfer comment: had diffiuclty using crutches due fair/poor standing balance, safer using RW  Ambulation/Gait Ambulation/Gait assistance: Supervision Ambulation Distance (Feet): 35 Feet Assistive device: Rolling walker (2 wheeled);Crutches Gait Pattern/deviations: Decreased step length - left;Decreased stance time - left;Decreased stride length    Gait velocity interpretation: Below normal speed for age/gender General Gait Details: demonstrates mostly 4 point gait pattern using axillary crutches with 2 near falls, improved balance using RW without loss of balance and states she feels safer using crutches  Stairs            Wheelchair Mobility    Modified Rankin (Stroke Patients Only)       Balance Overall balance assessment: Needs assistance Sitting-balance support: Feet supported;No upper extremity supported Sitting balance-Leahy Scale: Good     Standing balance support: Bilateral upper extremity supported;During functional activity Standing balance-Leahy Scale: Fair Standing balance comment: fair/poor with axillary crutches, fair with RW                             Pertinent Vitals/Pain Pain Assessment: No/denies pain    Home Living Family/patient expects to be discharged to:: Private residence Living Arrangements: Non-relatives/Friends Available Help at Discharge: Friend(s) Type of Home: Mobile home Home Access: Stairs to enter Entrance Stairs-Rails: Right Entrance Stairs-Number of Steps: 7 Home Layout: One level Home Equipment: None      Prior Function Level of Independence: Independent         Comments: community ambulator, drives, works as a Dance movement psychotherapistcashier     Hand Dominance        Extremity/Trunk Assessment   Upper Extremity Assessment Upper Extremity Assessment: Overall WFL for tasks assessed    Lower Extremity Assessment Lower Extremity Assessment: Overall WFL for tasks assessed;LLE deficits/detail LLE Deficits / Details: grossly 3+/5 exept left ankle/foot not tested due to surgery    Cervical / Trunk Assessment Cervical / Trunk Assessment: Normal  Communication   Communication: No difficulties  Cognition  Arousal/Alertness: Awake/alert Behavior During Therapy: WFL for tasks assessed/performed Overall Cognitive Status: Within Functional Limits for tasks assessed                                         General Comments      Exercises     Assessment/Plan    PT Assessment Patient needs continued PT services  PT Problem List Decreased strength;Decreased activity tolerance;Decreased balance;Decreased mobility       PT Treatment Interventions Gait training;Functional mobility training;Therapeutic activities;Stair training;Therapeutic exercise;Patient/family education    PT Goals (Current goals can be found in the Care Plan section)  Acute Rehab PT Goals Patient Stated Goal: return home PT Goal Formulation: With patient Time For Goal Achievement: 03/04/18 Potential to Achieve Goals: Good    Frequency Min 4X/week   Barriers to discharge        Co-evaluation               AM-PAC PT "6 Clicks" Daily Activity  Outcome Measure Difficulty turning over in bed (including adjusting bedclothes, sheets and blankets)?: None Difficulty moving from lying on back to sitting on the side of the bed? : None Difficulty sitting down on and standing up from a chair with arms (e.g., wheelchair, bedside commode, etc,.)?: A Little Help needed moving to and from a bed to chair (including a wheelchair)?: A Little Help needed walking in hospital room?: A Little Help needed climbing 3-5 steps with a railing? : A Lot 6 Click Score: 19    End of Session Equipment Utilized During Treatment: Gait belt Activity Tolerance: Patient tolerated treatment well;Patient limited by fatigue Patient left: in chair;with call bell/phone within reach Nurse Communication: Mobility status PT Visit Diagnosis: Unsteadiness on feet (R26.81);Other abnormalities of gait and mobility (R26.89);Muscle weakness (generalized) (M62.81)    Time: 1610-9604 PT Time Calculation (min) (ACUTE ONLY): 32 min   Charges:   PT Evaluation $PT Eval Moderate Complexity: 1 Mod     PT G Codes:        12:10 PM, 03-29-2018 Ocie Bob, MPT Physical Therapist with [facility] 336 [phone] office [phone] mobile phone

## 2018-03-01 NOTE — Discharge Instructions (Signed)
OUT OF WORK 12 WEEKS

## 2018-03-01 NOTE — Discharge Summary (Addendum)
Physician Discharge Summary  Patient ID: Courtney Fitzgerald MRN: [id number] DOB/AGE: 55/06/1961 55 y.o.  Admit date: 02/27/2018 Discharge date: 03/01/2018  Admission Diagnoses: Trimalleolar fracture left ankle  Discharge Diagnoses: Same Active Problems:   Trimalleolar fracture of ankle, closed, left, initial encounter   Closed trimalleolar fracture of left ankle  Condition stable  Hospital course admission date was April 3 surgery date was April 4  We placed a [employer] ankle solutions lateral plate with multiple screws and 2 1.60 K wires with a medial fixation the posterior malleolus fracture was small and did not need fixation  The patient tolerated walker partial weightbearing on April 5  She was stable and discharged  Discharge Exam: Blood pressure (!) 154/70, pulse 65, temperature 98.2 F (36.8 C), temperature source Oral, resp. rate 18, height 5\' 7"  (1.702 m), weight 192 lb 10.9 oz (87.4 kg), SpO2 99 %.   Disposition: Discharge disposition: 01-Home or Self Care       Discharge Instructions    Call MD / Call 911   Complete by:  As directed    If you experience chest pain or shortness of breath, CALL 911 and be transported to the hospital emergency room.  If you develope a fever above 101 F, pus (white drainage) or increased drainage or redness at the wound, or calf pain, call your surgeon's office.   Constipation Prevention   Complete by:  As directed    Drink plenty of fluids.  Prune juice may be helpful.  You may use a stool softener, such as Colace (over the counter) 100 mg twice a day.  Use MiraLax (over the counter) for constipation as needed.   Diet - low sodium heart healthy   Complete by:  As directed    Discharge instructions   Complete by:  As directed    Weight bearing as instructed with your therapist   Keep your foot elevated if the toes start to swell ice the foot and ankle placed the ice right over the foot and ankle for 30 minutes several times a day   Increase activity slowly as tolerated   Complete by:  As directed      Allergies as of 03/01/2018   No Known Allergies     Medication List    TAKE these medications   HYDROcodone-acetaminophen 7.5-325 MG tablet Commonly known as:  NORCO Take 1 tablet by mouth every 4 (four) hours as needed for moderate pain.   sodium chloride 0.65 % Soln nasal spray Commonly known as:  OCEAN Place 1 spray into both nostrils as needed for congestion.      Follow-up Information    Vickki HearingHarrison, Laquita Harlan E, MD Follow up on 03/14/2018.   Specialties:  Orthopedic Surgery, Radiology Why:  X-rays sutures out staples out cast or boot or Aircast Contact information: [address] [place] [address] [phone]           Signed: Fuller CanadaStanley Andalyn Heckstall 03/01/2018, 1:01 PM

## 2018-03-01 NOTE — Clinical Social Work Note (Signed)
Received CSW consult for SNF placement. Discussed with RN CM. This appears to be one of Dr. Mort SawyersHarrison's standing orders. Anticipating pt will dc home. Pt does not have insurance and won't be eligible for SNF unless she can private pay. Will follow if it is determined pt needs SNF and can private pay. Will clear the consult for now.

## 2018-03-01 NOTE — Plan of Care (Signed)
  Problem: Acute Rehab PT Goals(only PT should resolve) Goal: Patient Will Transfer Sit To/From Stand Outcome: Progressing Flowsheets (Taken 03/01/2018 1212) Patient will transfer sit to/from stand: with supervision Goal: Pt Will Transfer Bed To Chair/Chair To Bed Outcome: Progressing Flowsheets (Taken 03/01/2018 1212) Pt will Transfer Bed to Chair/Chair to Bed: with supervision Goal: Pt Will Ambulate Outcome: Progressing Flowsheets (Taken 03/01/2018 1212) Pt will Ambulate: 50 feet;with supervision;with rolling walker   12:13 PM, 03/01/18 Ocie BobJames Nery Frappier, MPT Physical Therapist with [facility] [phone] office [phone] mobile phone

## 2018-03-01 NOTE — Addendum Note (Signed)
Addendum  created 03/01/18 1125 by Despina HiddenIdacavage, Habeeb Puertas J, CRNA   Sign clinical note

## 2018-03-01 NOTE — Progress Notes (Signed)
Discharge instructions gone over with patient, verbalized understanding. IV removed, patient tolerated procedure well. Printed prescription given to patient. 

## 2018-03-01 NOTE — Progress Notes (Signed)
Subjective: 1 Day Post-Op Procedure(s) (LRB): OPEN REDUCTION INTERNAL FIXATION (ORIF) LEFT ANKLE FRACTURE (Left) Patient reports pain as 0 on 0-10 scale.    Objective: BP (!) 171/90 (BP Location: Left Arm)   Pulse 70   Temp 98.1 F (36.7 C) (Oral)   Resp 18   Ht 5\' 7"  (1.702 m)   Wt 192 lb 10.9 oz (87.4 kg)   SpO2 97%   BMI 30.18 kg/m   Intake/Output from previous day: 04/04 0701 - 04/05 0700 In: 2326.5 [P.O.:480; I.V.:1846.5] Out: 325 [Urine:300; Blood:25] Intake/Output this shift: No intake/output data recorded.  Recent Labs    02/27/18 1341  HGB 13.6   Recent Labs    02/27/18 1341  WBC 7.2  RBC 4.19  HCT 39.6  PLT 205   Recent Labs    02/27/18 1341  NA 139  K 3.6  CL 101  CO2 26  BUN 11  CREATININE 0.64  GLUCOSE 116*  CALCIUM 9.2   No results for input(s): LABPT, INR in the last 72 hours.  Compartment soft  She is awake and alert she is very comfortable she has no pain in her surgical site.  She did have a popliteal block still has some residual motor dysfunction from that which will resolve.  We plan on physical therapy getting her up with a walker and discharging her later this afternoon    Assessment/Plan: 1 Day Post-Op Procedure(s) (LRB): OPEN REDUCTION INTERNAL FIXATION (ORIF) LEFT ANKLE FRACTURE (Left) Discharge home today    Fuller CanadaStanley Keyle Doby 03/01/2018, 7:45 AM

## 2018-03-04 ENCOUNTER — Telehealth: Payer: Self-pay | Admitting: Orthopedic Surgery

## 2018-03-04 NOTE — Telephone Encounter (Signed)
Call received from [facility] physical therapist Denzil MagnusonBen Gentry per voice message 03/04/18, 9:05a.m; states he saw patient Saturday, 03/02/18 for initial visit, following hospital discharge 03/01/18. States a charity care referral for short term care and for education. States scheduled to see patient 1 time per week for 1 week,2 times per week for 1 week, and 1 time per week for 2 weeks. States he noted some blood under ace wrap, said removed the ace wrap and the bloody discharge did look fairly recent, but was not currently bleeding at time of visit on 03/02/18.  States he educated her about protecting the surgical site while in wheelchair.  States if any questions, please call him at ph#[phone].

## 2018-03-04 NOTE — Telephone Encounter (Signed)
I discussed with Dr Romeo AppleHarrison, he wants me to not take splint off but see her this pm to add to the bandage.   She will be here at 1:30 for me to work on her bandages.

## 2018-03-04 NOTE — Telephone Encounter (Signed)
I spoke to patient she has had bloody drainage all the way through bandage and onto the pillow she has leg propped on. Is it okay for me to see her this afternoon and change out her bandage and reapply splint?

## 2018-03-04 NOTE — Telephone Encounter (Signed)
Patient called to schedule post op appointment, she states she is having some bloody drainage and was wondering what she needs to do.  Please call and advise. [phone]

## 2018-03-04 NOTE — Telephone Encounter (Signed)
I called patient about this, but her voice mail has not been activated yet. I need to know how much blood she has on the bandage and discuss with her.

## 2018-03-04 NOTE — Telephone Encounter (Signed)
Thanks, this is reassuring. I will discuss with patient when she calls back.

## 2018-03-12 ENCOUNTER — Encounter: Payer: Self-pay | Admitting: Orthopedic Surgery

## 2018-03-13 DIAGNOSIS — Z9889 Other specified postprocedural states: Secondary | ICD-10-CM | POA: Insufficient documentation

## 2018-03-13 DIAGNOSIS — Z8781 Personal history of (healed) traumatic fracture: Principal | ICD-10-CM

## 2018-03-14 ENCOUNTER — Encounter: Payer: Self-pay | Admitting: Orthopedic Surgery

## 2018-03-14 ENCOUNTER — Ambulatory Visit (INDEPENDENT_AMBULATORY_CARE_PROVIDER_SITE_OTHER): Payer: Self-pay

## 2018-03-14 ENCOUNTER — Ambulatory Visit (INDEPENDENT_AMBULATORY_CARE_PROVIDER_SITE_OTHER): Payer: Self-pay | Admitting: Orthopedic Surgery

## 2018-03-14 VITALS — BP 161/98 | HR 90 | Ht 67.0 in

## 2018-03-14 DIAGNOSIS — Z967 Presence of other bone and tendon implants: Secondary | ICD-10-CM

## 2018-03-14 DIAGNOSIS — Z9889 Other specified postprocedural states: Secondary | ICD-10-CM

## 2018-03-14 DIAGNOSIS — Z8781 Personal history of (healed) traumatic fracture: Secondary | ICD-10-CM

## 2018-03-14 DIAGNOSIS — S82852D Displaced trimalleolar fracture of left lower leg, subsequent encounter for closed fracture with routine healing: Secondary | ICD-10-CM

## 2018-03-14 MED ORDER — HYDROCODONE-ACETAMINOPHEN 7.5-325 MG PO TABS: 1.0000 | ORAL_TABLET | ORAL | 0 refills | Status: DC | PRN

## 2018-03-14 NOTE — Patient Instructions (Addendum)
Weightbearing status toe-touch for balance

## 2018-03-14 NOTE — Progress Notes (Addendum)
POSTOP VISIT #1  POD # 14  BP (!) 161/98   Pulse 90   Ht 5\' 7"  (1.702 m)   BMI 30.18 kg/m   Encounter Diagnosis  Name Primary?  . S/P ORIF (open reduction internal fixation) fracture left ankle 02/28/18 Yes    Current Meds  Medication Sig  . HYDROcodone-acetaminophen (NORCO) 7.5-325 MG tablet Take 1 tablet by mouth every 4 (four) hours as needed for moderate pain.  . sodium chloride (OCEAN) 0.65 % SOLN nasal spray Place 1 spray into both nostrils as needed for congestion.    Surgical incision  Imaging see the dictated report the fixation is stable and there are no complications related to the hardware the ankle mortise is intact.  The trimalleolar fracture has been reduced   Postoperative plan  Short leg cast  Weightbearing status toe-touch weightbearing for balance  Work status out of work 12 weeks from surgery see letters for exact date  Return to clinic 4 weeks x-rays out of plaster or brace  Meds ordered this encounter  Medications  . HYDROcodone-acetaminophen (NORCO) 7.5-325 MG tablet    Sig: Take 1 tablet by mouth every 4 (four) hours as needed for moderate pain.    Dispense:  30 tablet    Refill:  0

## 2018-04-12 ENCOUNTER — Encounter: Payer: Self-pay | Admitting: Orthopedic Surgery

## 2018-04-12 ENCOUNTER — Ambulatory Visit (INDEPENDENT_AMBULATORY_CARE_PROVIDER_SITE_OTHER): Payer: Self-pay

## 2018-04-12 ENCOUNTER — Ambulatory Visit (INDEPENDENT_AMBULATORY_CARE_PROVIDER_SITE_OTHER): Payer: Self-pay | Admitting: Orthopedic Surgery

## 2018-04-12 VITALS — BP 163/110 | HR 88 | Ht 67.0 in

## 2018-04-12 DIAGNOSIS — S82852A Displaced trimalleolar fracture of left lower leg, initial encounter for closed fracture: Secondary | ICD-10-CM

## 2018-04-12 DIAGNOSIS — Z967 Presence of other bone and tendon implants: Secondary | ICD-10-CM

## 2018-04-12 DIAGNOSIS — Z8781 Personal history of (healed) traumatic fracture: Secondary | ICD-10-CM

## 2018-04-12 DIAGNOSIS — Z9889 Other specified postprocedural states: Secondary | ICD-10-CM

## 2018-04-12 MED ORDER — HYDROCODONE-ACETAMINOPHEN 5-325 MG PO TABS: 1.0000 | ORAL_TABLET | Freq: Four times a day (QID) | ORAL | 0 refills | Status: DC | PRN

## 2018-04-12 MED ORDER — IBUPROFEN 800 MG PO TABS: 800.0000 mg | ORAL_TABLET | Freq: Three times a day (TID) | ORAL | 1 refills | Status: DC | PRN

## 2018-04-12 NOTE — Patient Instructions (Signed)
Steps to Quit Smoking Smoking tobacco can be bad for your health. It can also affect almost every organ in your body. Smoking puts you and people around you at risk for many serious long-lasting (chronic) diseases. Quitting smoking is hard, but it is one of the best things that you can do for your health. It is never too late to quit. What are the benefits of quitting smoking? When you quit smoking, you lower your risk for getting serious diseases and conditions. They can include:  Lung cancer or lung disease.  Heart disease.  Stroke.  Heart attack.  Not being able to have children (infertility).  Weak bones (osteoporosis) and broken bones (fractures).  If you have coughing, wheezing, and shortness of breath, those symptoms may get better when you quit. You may also get sick less often. If you are pregnant, quitting smoking can help to lower your chances of having a baby of low birth weight. What can I do to help me quit smoking? Talk with your doctor about what can help you quit smoking. Some things you can do (strategies) include:  Quitting smoking totally, instead of slowly cutting back how much you smoke over a period of time.  Going to in-person counseling. You are more likely to quit if you go to many counseling sessions.  Using resources and support systems, such as: ? Online chats with a counselor. ? Phone quitlines. ? Printed self-help materials. ? Support groups or group counseling. ? Text messaging programs. ? Mobile phone apps or applications.  Taking medicines. Some of these medicines may have nicotine in them. If you are pregnant or breastfeeding, do not take any medicines to quit smoking unless your doctor says it is okay. Talk with your doctor about counseling or other things that can help you.  Talk with your doctor about using more than one strategy at the same time, such as taking medicines while you are also going to in-person counseling. This can help make  quitting easier. What things can I do to make it easier to quit? Quitting smoking might feel very hard at first, but there is a lot that you can do to make it easier. Take these steps:  Talk to your family and friends. Ask them to support and encourage you.  Call phone quitlines, reach out to support groups, or work with a counselor.  Ask people who smoke to not smoke around you.  Avoid places that make you want (trigger) to smoke, such as: ? Bars. ? Parties. ? Smoke-break areas at work.  Spend time with people who do not smoke.  Lower the stress in your life. Stress can make you want to smoke. Try these things to help your stress: ? Getting regular exercise. ? Deep-breathing exercises. ? Yoga. ? Meditating. ? Doing a body scan. To do this, close your eyes, focus on one area of your body at a time from head to toe, and notice which parts of your body are tense. Try to relax the muscles in those areas.  Download or buy apps on your mobile phone or tablet that can help you stick to your quit plan. There are many free apps, such as QuitGuide from the CDC (Centers for Disease Control and Prevention). You can find more support from smokefree.gov and other websites.  This information is not intended to replace advice given to you by your health care provider. Make sure you discuss any questions you have with your health care provider. Document Released: 09/09/2009 Document   Revised: 07/11/2016 Document Reviewed: 03/30/2015 Elsevier Interactive Patient Education  2018 Elsevier Inc.  

## 2018-04-12 NOTE — Progress Notes (Signed)
POSTOP VISIT  POD # 43 (4/4-7/3)  Chief Complaint  Patient presents with  . Post-op Follow-up    left ankle ORIF 02/28/18    57 year old female status post internal fixation medial lateral side left ankle doing well complains of tight cast and hot cast.  Wants medications refilled.   Encounter Diagnoses  Name Primary?  . S/P ORIF (open reduction internal fixation) fracture left ankle 02/28/18 Yes  . Trimalleolar fracture of ankle, closed, left, initial encounter    Both wounds look good she has a plantigrade foot with the knee flexed  X-ray shows fixation is stable Postoperative plan (Work, WB,FU)  Weightbearing status as tolerated in a Cam walker  Continue out of work  Follow-up 6 weeks x-rays out of [facility]

## 2018-05-27 ENCOUNTER — Ambulatory Visit (INDEPENDENT_AMBULATORY_CARE_PROVIDER_SITE_OTHER): Payer: Self-pay

## 2018-05-27 ENCOUNTER — Ambulatory Visit (INDEPENDENT_AMBULATORY_CARE_PROVIDER_SITE_OTHER): Payer: Self-pay | Admitting: Orthopedic Surgery

## 2018-05-27 ENCOUNTER — Encounter: Payer: Self-pay | Admitting: Orthopedic Surgery

## 2018-05-27 VITALS — BP 170/90 | HR 93 | Ht 67.0 in | Wt 188.0 lb

## 2018-05-27 DIAGNOSIS — Z9889 Other specified postprocedural states: Secondary | ICD-10-CM

## 2018-05-27 DIAGNOSIS — S82852A Displaced trimalleolar fracture of left lower leg, initial encounter for closed fracture: Secondary | ICD-10-CM

## 2018-05-27 DIAGNOSIS — Z8781 Personal history of (healed) traumatic fracture: Secondary | ICD-10-CM

## 2018-05-27 DIAGNOSIS — Z967 Presence of other bone and tendon implants: Secondary | ICD-10-CM

## 2018-05-27 MED ORDER — HYDROCODONE-ACETAMINOPHEN 5-325 MG PO TABS
1.0000 | ORAL_TABLET | Freq: Two times a day (BID) | ORAL | 0 refills | Status: DC | PRN
Start: 2018-05-27 — End: 2021-02-25

## 2018-05-27 NOTE — Progress Notes (Signed)
Postop visit status post open treatment internal fixation of the right ankle  Patient complains of numbness on top of the foot rubbing from the Aircast walker and inability to walk without the boot  She is about 12 weeks out now her x-ray looks good she has excellent motion she does have some decreased sensation in the dorsum of the foot and there is some discomfort when palpating over the fibula  Again x-ray looks great fracture has healed ankle mortise is intact  Recommend Aircast walker try to walk weightbearing as tolerated with Aircast  I counseled her on opioid use  We refilled her hydrocodone 1 every 12  Fill Date ID Written Drug Qty Days Prescriber Rx # Pharmacy Refill Daily Dose * Pymt Type PMP 04/12/2018 1 04/12/2018 Hydrocodone-Acetamin 5-325 Mg  [address] [id number] [id number] 0 18.75 [facility] Private [facility] [place] 03/14/2018 1 03/14/2018 Hydrocodone-Acetamin 7.5-325  [address] [id number] [id number] 0 45.00 [facility] Private Pay [place] 03/02/2018 1 03/01/2018 Hydrocodone-Acetamin 7.5-325  [address] [phone] [facility] ([id number] 0 45.00 [facility] Private [facility] [place]

## 2018-05-27 NOTE — Patient Instructions (Signed)
Aircast weight-bear as tolerated  What You Need to Know About Prescription Opioid Pain Medicine        Please be advised. You are on a medication which is classified as an "opiod". The CDC the [facility]  has recently advised all providers to advise patient's that these medications have certain risks which include but are not limited to:    drug intolerance  drug addiction  respiratory depression   respiratory failure  Death  Please keep these medications locked away. If you feel that you are becoming addicted to these medicines or you are having difficulties with these medications please alert your provider.   As your provider I will attempt to wean you off of these medications when you're severe acute pain has been taking care of. However, if we cannot wean you off of this medication you will be sent to a pain management center where they can better manage chronic pain   Opioids are powerful medicines that are used to treat moderate to severe pain. Opioids should be taken with the supervision of a trained health care provider. They should be taken for the shortest period of time as possible. This is because opioids can be addictive and the longer you take opioids, the greater your risk of addiction (opioid use disorder). What do opioids do? Opioids help reduce or eliminate pain. When used for short periods of time, they can help you:  Sleep better.  Do better in physical or occupational therapy.  Feel better in the first few days after an injury.  Recover from surgery. What kind of problems can opioids cause? Opioids can cause side effects, such as:  Constipation.  Nausea.  Vomiting.  Drowsiness.  Confusion.  Opioid use disorder.  Breathing difficulties (respiratory depression). Using opioid pain medicines for longer than 3 days increases your risk of these side effects. Taking opioid pain medicine for a long period of time can affect your  ability to do daily tasks. It also puts you at risk for:  Car accidents.  Heart attack.  Overdose, which can sometimes lead to death. What can increase my risk for developing problems while taking opioids? You may be at an especially high risk for problems while taking opioids if you:  Are over the age of 55.  Are pregnant.  Have kidney or liver disease.  Have certain mental health conditions, such as depression or anxiety.  Have a history of substance use disorder.  Have had an opioid overdose in the past. How do I stop taking opioids if I have been taking them for a long time? If you have been taking opioid medicine for more than a few weeks, you may need to slowly stop taking them (taper). Tapering your use of opioids can decrease your chances of experiencing withdrawal symptoms, such as:  Abdominal pain and cramping.  Nausea.  Sweating.  Sleepiness.  Restlessness.  Uncontrollable shaking (tremors).  Cravings for the medicine. Do not attempt to taper your use of opioids on your own. Talk with your health care provider about how to do this. Your health care provider may prescribe a step-down schedule based on how much medicine you are taking and how long you have been taking it. What are the benefits of stopping the use of opioids? By switching from opioid pain medicine to non-opioid pain management options, you will decrease your risk of accidents and injuries associated with long-term opioid use. You will also be able to:  Monitor your pain more  accurately and know when to seek medical care if it is not improving.  Decrease risk to others around you. Having opioids in the home increases the risk for accidental or intentional use or overdose by others. How can I treat pain without opioids? Pain can be managed with many types of alternative treatments. Ask your health care provider to refer you to one or more specialists who can help you manage pain through:  Physical  or occupational therapy.  Counseling (cognitive-behavioral therapy).  Good nutrition.  Biofeedback.  Massage.  Meditation.  Non-opioid medicine.  Following a gentle exercise program. Where can I get support? If you have been taking opioids for a long time, you may benefit from receiving support for quitting from a local support group or counselor. Ask your health care provider for a referral to these resources in your area. When should I seek medical care? Seek medical care right away if you are taking opioids and you experience any of the following:  Difficulty breathing.  Breathing that is more shallow or slower than normal.  A very slow heartbeat (pulse).  Severe confusion.  Unconsciousness.  Sleepiness.  Difficulty waking from sleep.  Slurred speech.  Nausea and vomiting.  Cold, clammy skin.  Blue lips or fingernails.  Limpness.  Abnormally small pupils. If you think that you or someone else may have taken too much of an opioid medicine, get medical help right away. Do not wait to see if the symptoms go away on their own. Call your local emergency services (911 in the [place] or call the hotline of the [facility] (812)472-3665 in the [place]  Where can I get more information? To learn more about opioid medicines, visit the Centers for Disease Control and Prevention web site Opioid Basics at [employer].com.pt. Summary  Opioid medicines can help you manage moderate-to-severe pain for a short period of time.  Taking opioid pain medicine for a long period of time puts you at risk for unintentional accidents, injury, and even death.  If you think that you or someone else may have taken too much of an opioid, get medical help right away. This information is not intended to replace advice given to you by your health care provider. Make sure you discuss any questions you have with your health care  provider. Document Released: 12/10/2015 Document Revised: 07/07/2016 Document Reviewed: 06/25/2015 Elsevier Interactive Patient Education  2017 [employer].

## 2018-06-24 ENCOUNTER — Other Ambulatory Visit: Payer: Self-pay | Admitting: Orthopedic Surgery

## 2018-06-24 DIAGNOSIS — Z8781 Personal history of (healed) traumatic fracture: Principal | ICD-10-CM

## 2018-06-24 DIAGNOSIS — Z9889 Other specified postprocedural states: Secondary | ICD-10-CM

## 2018-06-24 DIAGNOSIS — S82852A Displaced trimalleolar fracture of left lower leg, initial encounter for closed fracture: Secondary | ICD-10-CM

## 2018-06-24 NOTE — Telephone Encounter (Signed)
Refill request (Dr Romeo AppleHarrison patient - Dr Romeo AppleHarrison out of clinic until 07/05/18) received via fax also, [facility], [place], for medication: Ibuprofen 800mg  tablet quantity 90, 1 tablet by mouth every 8 hours as needed.

## 2018-07-09 ENCOUNTER — Ambulatory Visit (INDEPENDENT_AMBULATORY_CARE_PROVIDER_SITE_OTHER): Payer: Self-pay | Admitting: Orthopedic Surgery

## 2018-07-09 VITALS — BP 160/88 | HR 77 | Ht 67.0 in | Wt 188.0 lb

## 2018-07-09 DIAGNOSIS — Z8781 Personal history of (healed) traumatic fracture: Secondary | ICD-10-CM

## 2018-07-09 DIAGNOSIS — Z9889 Other specified postprocedural states: Secondary | ICD-10-CM

## 2018-07-09 DIAGNOSIS — S82852A Displaced trimalleolar fracture of left lower leg, initial encounter for closed fracture: Secondary | ICD-10-CM

## 2018-07-09 DIAGNOSIS — Z967 Presence of other bone and tendon implants: Secondary | ICD-10-CM

## 2018-07-09 NOTE — Progress Notes (Signed)
Chief Complaint  Patient presents with  . Follow-up    Recheck on left ankle, DOS 02-28-18.   She has a couple of complaints she has some numbness in the fourth digit of the toe and then right across the joint and some pain when she is walking at the anterior joint line  She has full range of motion now.  She is ambulatory with a Aircast with a slight Achilles gait although her Achilles tendon flexion is normal as is the extension.  Wounds are clean dry and intact fracture sites are nontender  Encounter Diagnoses  Name Primary?  . S/P ORIF (open reduction internal fixation) fracture left ankle 02/28/18 Yes  . Trimalleolar fracture of ankle, closed, left, initial encounter      X-ray at 1 year

## 2018-11-20 IMAGING — RF DG ANKLE COMPLETE 3+V*L*
1 series · 11 of 11 positions shown · non-contrast
Comparison: Radiographs February 27, 2018.

CLINICAL DATA: Open reduction and internal fixation of left ankle
fracture.

EXAM:
LEFT ANKLE COMPLETE - 3+ VIEW; DG C-ARM 1-60 MIN-NO REPORT
Radiation exposure index: 2.01 mGy.

[Series 1: run · 11 of 11 slices shown]
[im 1/11]
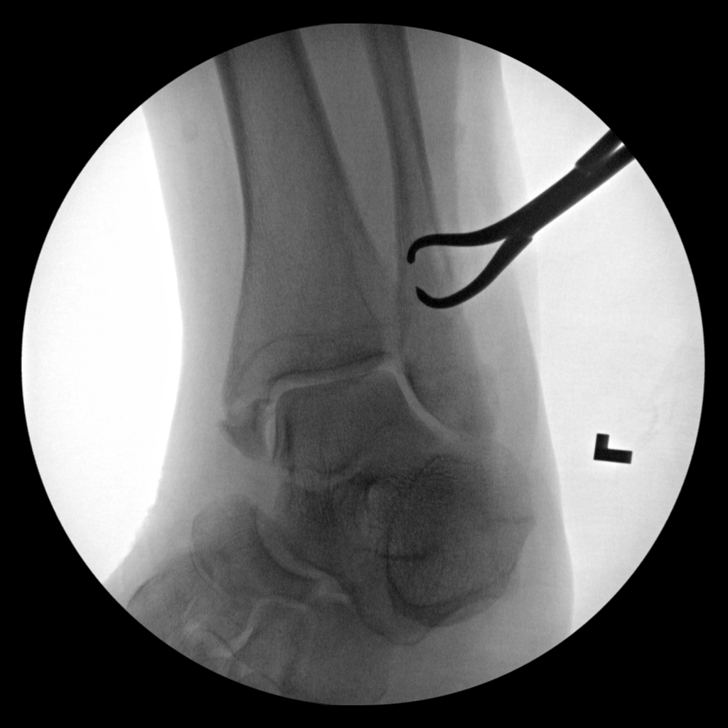
[im 2/11]
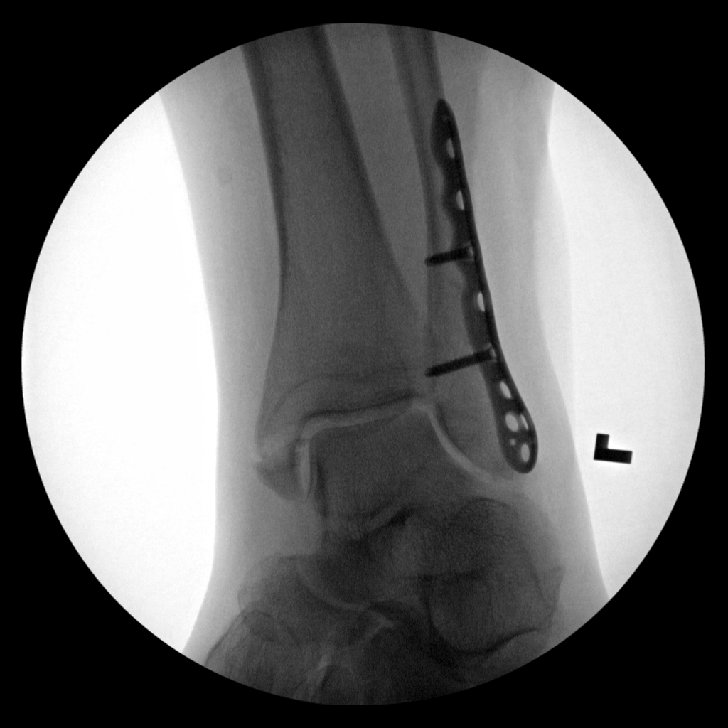
[im 3/11]
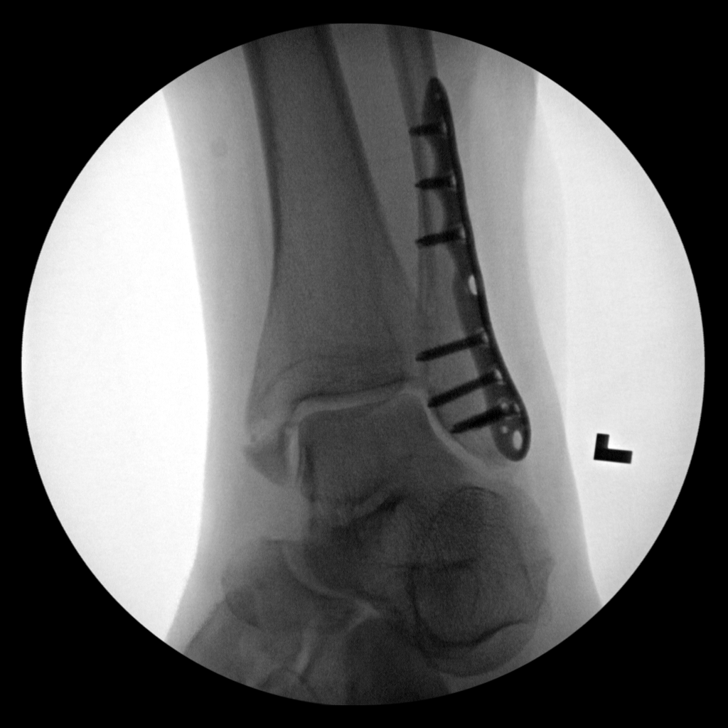
[im 4/11]
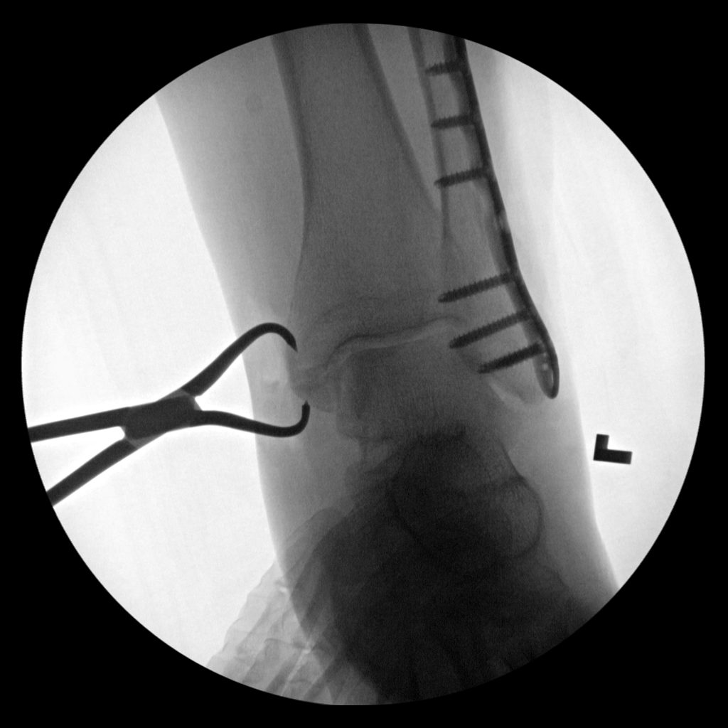
[im 5/11]
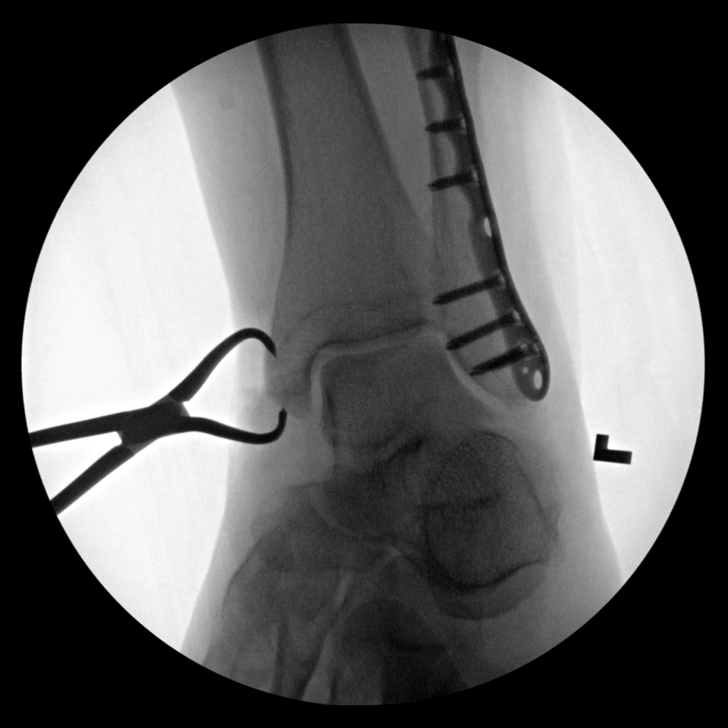
[im 6/11]
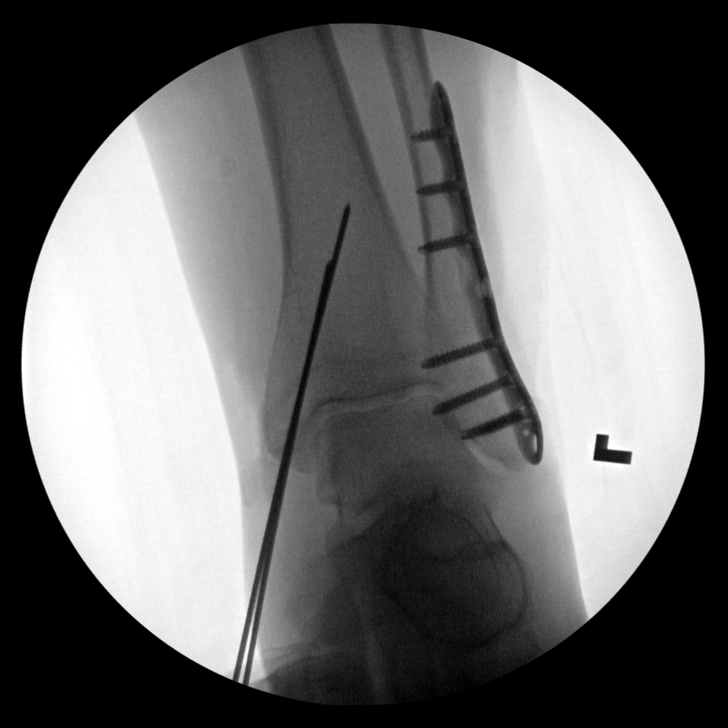
[im 7/11]
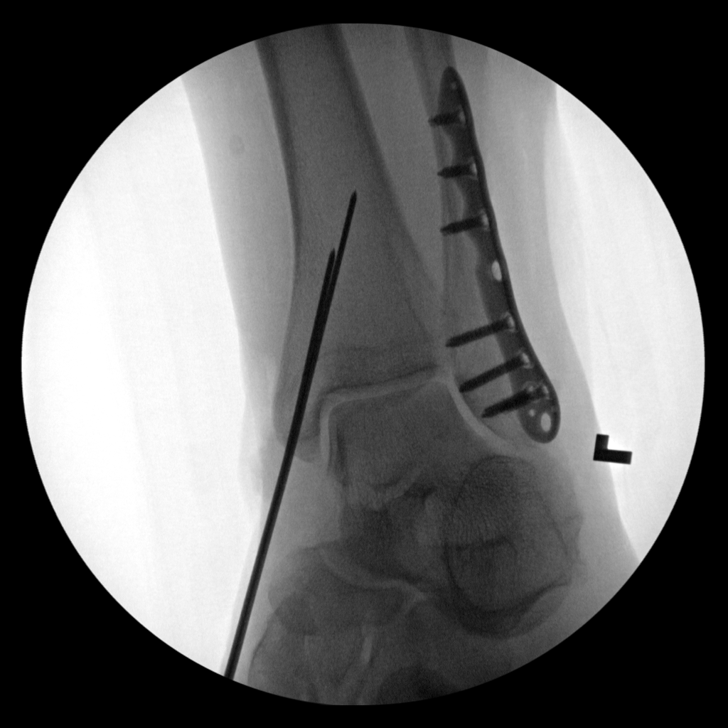
[im 8/11]
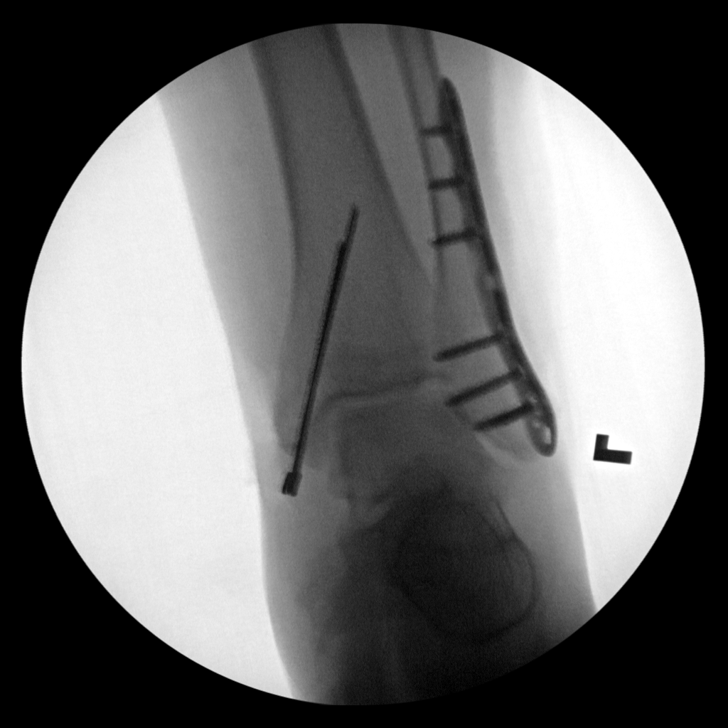
[im 9/11]
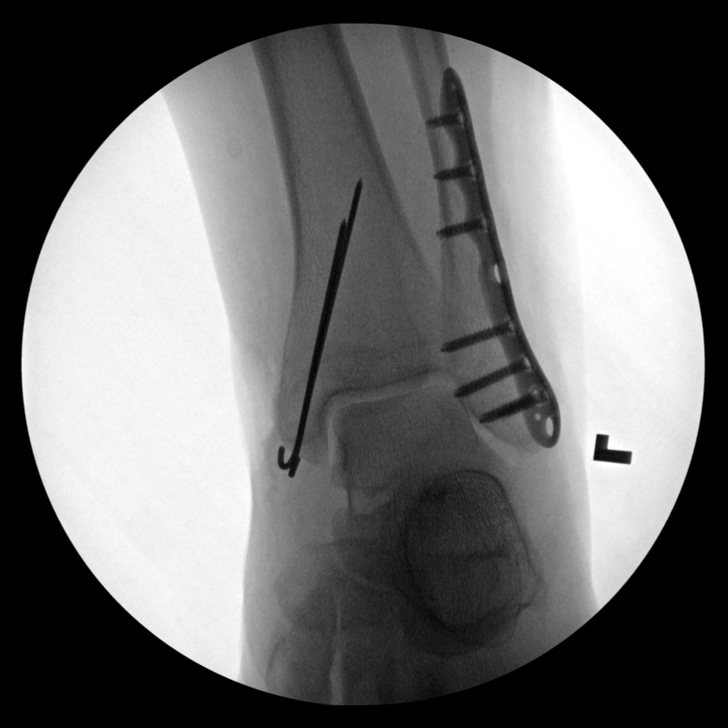
[im 10/11]
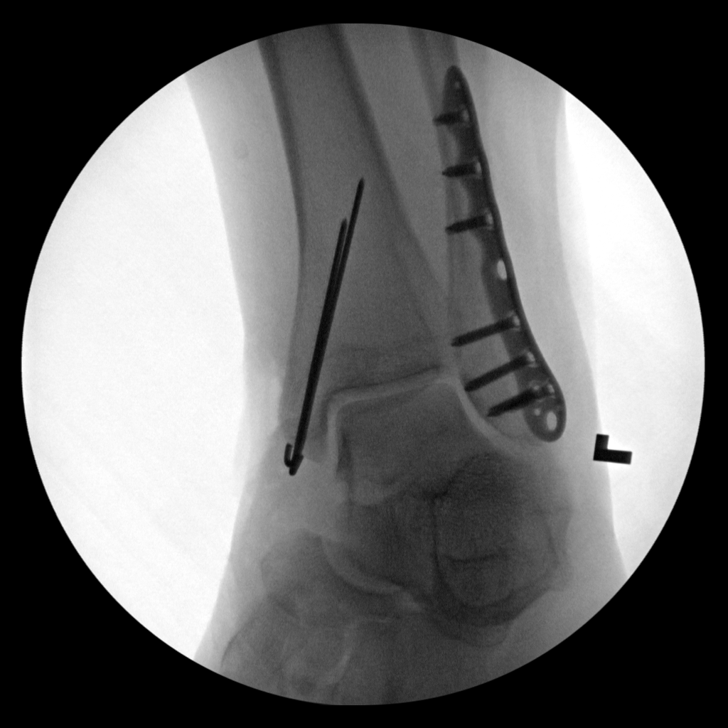
[im 11/11]
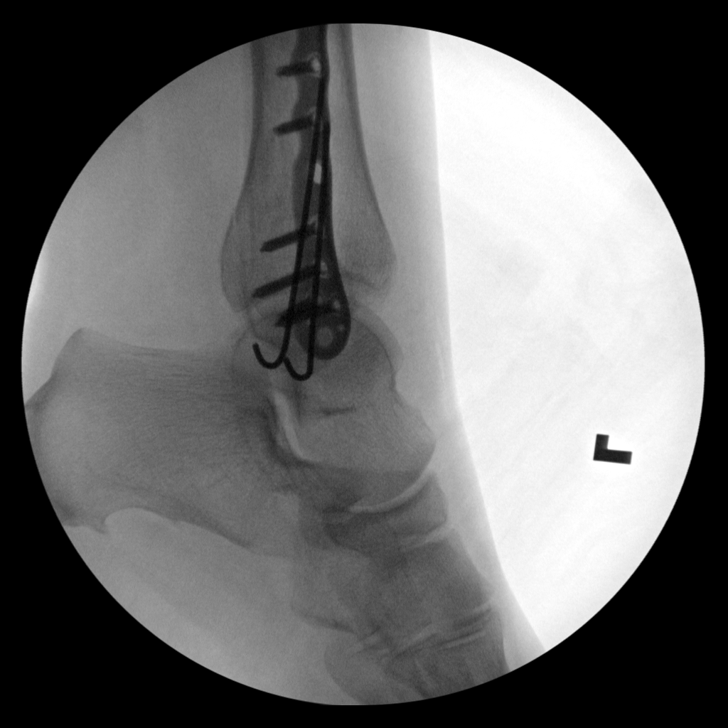

[11 of 11 positions shown; findings below may reference images not displayed]

FINDINGS: Eleven intraoperative fluoroscopic images were obtained of the left
ankle. These images demonstrate surgical internal fixation of distal
fibular and medial malleolar fractures. Good alignment of fracture
components is noted.
IMPRESSION: Status post surgical internal fixation of distal fibular and medial
malleolar fractures.

## 2019-02-28 ENCOUNTER — Other Ambulatory Visit: Payer: Self-pay | Admitting: Orthopaedic Surgery

## 2019-02-28 DIAGNOSIS — Z8781 Personal history of (healed) traumatic fracture: Principal | ICD-10-CM

## 2019-02-28 DIAGNOSIS — Z9889 Other specified postprocedural states: Secondary | ICD-10-CM

## 2019-02-28 DIAGNOSIS — S82852A Displaced trimalleolar fracture of left lower leg, initial encounter for closed fracture: Secondary | ICD-10-CM

## 2019-03-04 ENCOUNTER — Encounter: Payer: Self-pay | Admitting: Orthopedic Surgery

## 2019-03-12 ENCOUNTER — Ambulatory Visit: Payer: Self-pay | Admitting: Orthopedic Surgery

## 2021-01-21 DIAGNOSIS — S39012A Strain of muscle, fascia and tendon of lower back, initial encounter: Secondary | ICD-10-CM | POA: Diagnosis not present

## 2021-01-28 DIAGNOSIS — S39012A Strain of muscle, fascia and tendon of lower back, initial encounter: Secondary | ICD-10-CM | POA: Diagnosis not present

## 2021-01-28 DIAGNOSIS — Z6829 Body mass index (BMI) 29.0-29.9, adult: Secondary | ICD-10-CM | POA: Diagnosis not present

## 2021-01-28 DIAGNOSIS — M62830 Muscle spasm of back: Secondary | ICD-10-CM | POA: Diagnosis not present

## 2021-02-25 ENCOUNTER — Other Ambulatory Visit: Payer: Self-pay

## 2021-02-25 ENCOUNTER — Encounter: Payer: Self-pay | Admitting: Family Medicine

## 2021-02-25 ENCOUNTER — Ambulatory Visit (INDEPENDENT_AMBULATORY_CARE_PROVIDER_SITE_OTHER): Payer: BC Managed Care – PPO | Admitting: Family Medicine

## 2021-02-25 VITALS — BP 177/84 | HR 82 | Temp 97.3°F | Ht 66.75 in | Wt 175.1 lb

## 2021-02-25 DIAGNOSIS — Z23 Encounter for immunization: Secondary | ICD-10-CM | POA: Diagnosis not present

## 2021-02-25 DIAGNOSIS — Z6827 Body mass index (BMI) 27.0-27.9, adult: Secondary | ICD-10-CM

## 2021-02-25 DIAGNOSIS — I10 Essential (primary) hypertension: Secondary | ICD-10-CM

## 2021-02-25 DIAGNOSIS — Z7689 Persons encountering health services in other specified circumstances: Secondary | ICD-10-CM

## 2021-02-25 DIAGNOSIS — K047 Periapical abscess without sinus: Secondary | ICD-10-CM

## 2021-02-25 DIAGNOSIS — Z1231 Encounter for screening mammogram for malignant neoplasm of breast: Secondary | ICD-10-CM

## 2021-02-25 DIAGNOSIS — Z5320 Procedure and treatment not carried out because of patient's decision for unspecified reasons: Secondary | ICD-10-CM

## 2021-02-25 LAB — CBC WITH DIFFERENTIAL/PLATELET: Monocytes: 8 %

## 2021-02-25 MED ORDER — LISINOPRIL 20 MG PO TABS: 20.0000 mg | ORAL_TABLET | Freq: Every day | ORAL | 3 refills | Status: DC

## 2021-02-25 MED ORDER — AMOXICILLIN 500 MG PO CAPS: 500.0000 mg | ORAL_CAPSULE | Freq: Three times a day (TID) | ORAL | 0 refills | Status: AC

## 2021-02-25 NOTE — Patient Instructions (Signed)

## 2021-02-25 NOTE — Progress Notes (Signed)
New Patient Office Visit  Subjective:  Patient ID: Courtney Fitzgerald, female    DOB: Jun 08, 1961  Age: 60 y.o. MRN: [id number]  CC:  Chief Complaint  Patient presents with  . New Patient (Initial Visit)    HPI Courtney Fitzgerald presents for to establish care. She went to the dentist recently and her BP was 198/95. She has a dental abscess but they did not give her any antibiotics because of her BP. She reports 2 dental abscess on her lower right teeth. She denies chest pain, edema, dizziness, or shortness of breath. She reports that her diet needs improvement. She does not exercise, but she is active at work. Denies fever or chills.  She did drink some soda about 1.5 hours ago.   Past Medical History:  Diagnosis Date  . Allergy     Past Surgical History:  Procedure Laterality Date  . CESAREAN SECTION     1995  . CRYOABLATION    . ORIF ANKLE FRACTURE Left 02/28/2018   Procedure: OPEN REDUCTION INTERNAL FIXATION (ORIF) LEFT ANKLE FRACTURE;  Surgeon: Carole Civil, MD;  Location: [facility];  Service: Orthopedics;  Laterality: Left;    Family History  Problem Relation Age of Onset  . Cancer Mother        lung  . Hearing loss Mother   . Cancer Father        lung  . Heart disease Father   . Heart attack Father   . Hypertension Father   . Cancer Maternal Grandmother        stomach  . Diabetes Maternal Grandfather   . Cancer Paternal Grandmother        lung  . Heart attack Paternal Grandmother     Social History   Socioeconomic History  . Marital status: Legally Separated    Spouse name: Not on file  . Number of children: 2  . Years of education: 19  . Highest education level: High school graduate  Occupational History    Employer: [employer]  Tobacco Use  . Smoking status: Current Every Day Smoker    Packs/day: 0.50    Years: 18.00    Pack years: 9.00    Types: Cigarettes  . Smokeless tobacco: Never Used  Vaping Use  . Vaping Use: Never used  Substance and Sexual  Activity  . Alcohol use: Yes    Alcohol/week: 15.0 standard drinks    Types: 15 Cans of beer per week  . Drug use: Not Currently  . Sexual activity: Not Currently    Birth control/protection: Post-menopausal  Other Topics Concern  . Not on file  Social History Narrative  . Not on file   Social Determinants of Health   Financial Resource Strain: Not on file  Food Insecurity: Not on file  Transportation Needs: Not on file  Physical Activity: Not on file  Stress: Not on file  Social Connections: Not on file  Intimate Partner Violence: Not on file    ROS Review of Systems  As per HPI.   Objective:   Today's Vitals: BP (!) 177/84   Pulse 82   Temp (!) 97.3 F (36.3 C) (Temporal)   Ht 5' 6.75" (1.695 m)   Wt 175 lb 2 oz (79.4 kg)   SpO2 97% Comment: room air  BMI 27.63 kg/m   Physical Exam Vitals and nursing note reviewed.  Constitutional:      General: She is not in acute distress.    Appearance: Normal appearance. She  is not ill-appearing, toxic-appearing or diaphoretic.  HENT:     Head: Normocephalic and atraumatic.     Jaw: No tenderness or swelling.     Mouth/Throat:     Dentition: Dental abscesses (lower right x2) present.  Neck:     Vascular: No carotid bruit.  Cardiovascular:     Rate and Rhythm: Normal rate and regular rhythm.     Heart sounds: Normal heart sounds. No murmur heard.   Pulmonary:     Effort: Pulmonary effort is normal. No respiratory distress.  Musculoskeletal:     Right lower leg: No edema.     Left lower leg: No edema.  Skin:    General: Skin is warm and dry.  Neurological:     General: No focal deficit present.     Mental Status: She is alert and oriented to person, place, and time.     Assessment & Plan:   Geryl was seen today for new patient (initial visit).  Diagnoses and all orders for this visit:  Primary hypertension New diagnosis. Uncontrolled. Labs pending as below. Start lisinopril daily. Heart healthy diet and  exercise. Handout given.  -     CBC with Differential/Platelet -     CMP14+EGFR -     Lipid panel -     lisinopril (ZESTRIL) 20 MG tablet; Take 1 tablet (20 mg total) by mouth daily.  BMI 27.0-27.9,adult Diet and exercise.  -     CBC with Differential/Platelet -     CMP14+EGFR -     Lipid panel -     Thyroid Panel With TSH  Dental abscess Amoxicillin as below. Labs pending.  -     CBC with Differential/Platelet -     CMP14+EGFR -     amoxicillin (AMOXIL) 500 MG capsule; Take 1 capsule (500 mg total) by mouth 3 (three) times daily for 10 days.  Encounter for screening mammogram for malignant neoplasm of breast -     MM Digital Screening; Future  Colon cancer screening declined Declined today.   Need for vaccination Tdap today in office.   Encounter to establish care   Follow-up: Return in about 2 weeks (around 03/11/2021) for BP .   The patient indicates understanding of these issues and agrees with the plan.    Gwenlyn Perking, FNP

## 2021-02-25 NOTE — Addendum Note (Signed)
Addended by: Fara Olden on: 02/25/2021 09:45 AM   Modules accepted: Orders

## 2021-02-26 LAB — CBC WITH DIFFERENTIAL/PLATELET
Basophils Absolute: 0 10*3/uL (ref 0.0–0.2)
Basos: 1 %
EOS (ABSOLUTE): 0.2 10*3/uL (ref 0.0–0.4)
Eos: 3 %
Hematocrit: 41.7 % (ref 34.0–46.6)
Hemoglobin: 14.4 g/dL (ref 11.1–15.9)
Immature Grans (Abs): 0 10*3/uL (ref 0.0–0.1)
Immature Granulocytes: 0 %
Lymphocytes Absolute: 2 10*3/uL (ref 0.7–3.1)
Lymphs: 30 %
MCH: 31.8 pg (ref 26.6–33.0)
MCHC: 34.5 g/dL (ref 31.5–35.7)
MCV: 92 fL (ref 79–97)
Monocytes Absolute: 0.5 10*3/uL (ref 0.1–0.9)
Neutrophils Absolute: 3.9 10*3/uL (ref 1.4–7.0)
Neutrophils: 58 %
Platelets: 261 10*3/uL (ref 150–450)
RBC: 4.53 x10E6/uL (ref 3.77–5.28)
RDW: 12.2 % (ref 11.7–15.4)
WBC: 6.6 10*3/uL (ref 3.4–10.8)

## 2021-02-26 LAB — CMP14+EGFR
ALT: 15 [IU]/L (ref 0–32)
AST: 23 [IU]/L (ref 0–40)
Albumin/Globulin Ratio: 1.5 (ref 1.2–2.2)
Albumin: 4.3 g/dL (ref 3.8–4.9)
Alkaline Phosphatase: 95 [IU]/L (ref 44–121)
BUN/Creatinine Ratio: 16 (ref 9–23)
BUN: 13 mg/dL (ref 6–24)
Bilirubin Total: 0.5 mg/dL (ref 0.0–1.2)
CO2: 25 mmol/L (ref 20–29)
Calcium: 10.1 mg/dL (ref 8.7–10.2)
Chloride: 100 mmol/L (ref 96–106)
Creatinine, Ser: 0.8 mg/dL (ref 0.57–1.00)
Globulin, Total: 2.8 g/dL (ref 1.5–4.5)
Glucose: 97 mg/dL (ref 65–99)
Potassium: 4.5 mmol/L (ref 3.5–5.2)
Sodium: 138 mmol/L (ref 134–144)
Total Protein: 7.1 g/dL (ref 6.0–8.5)
eGFR: 86 mL/min/{1.73_m2}

## 2021-02-26 LAB — LIPID PANEL
Chol/HDL Ratio: 2.3 ratio (ref 0.0–4.4)
Cholesterol, Total: 243 mg/dL — ABNORMAL HIGH (ref 100–199)
HDL: 106 mg/dL (ref >39)
LDL Chol Calc (NIH): 122 mg/dL — ABNORMAL HIGH (ref 0–99)
Triglycerides: 90 mg/dL (ref 0–149)
VLDL Cholesterol Cal: 15 mg/dL (ref 5–40)

## 2021-02-26 LAB — THYROID PANEL WITH TSH
Free Thyroxine Index: 1.9 (ref 1.2–4.9)
T3 Uptake Ratio: 29 % (ref 24–39)
T4, Total: 6.4 ug/dL (ref 4.5–12.0)
TSH: 1.05 u[IU]/mL (ref 0.450–4.500)

## 2021-03-17 ENCOUNTER — Encounter: Payer: Self-pay | Admitting: Family Medicine

## 2021-03-17 ENCOUNTER — Other Ambulatory Visit: Payer: Self-pay

## 2021-03-17 ENCOUNTER — Ambulatory Visit (INDEPENDENT_AMBULATORY_CARE_PROVIDER_SITE_OTHER): Payer: BC Managed Care – PPO | Admitting: Family Medicine

## 2021-03-17 VITALS — BP 143/73 | HR 78 | Temp 98.4°F | Resp 20 | Ht 66.7 in | Wt 177.0 lb

## 2021-03-17 DIAGNOSIS — I10 Essential (primary) hypertension: Secondary | ICD-10-CM

## 2021-03-17 DIAGNOSIS — E7800 Pure hypercholesterolemia, unspecified: Secondary | ICD-10-CM

## 2021-03-17 MED ORDER — LOSARTAN POTASSIUM-HCTZ 100-12.5 MG PO TABS: 1.0000 | ORAL_TABLET | Freq: Every day | ORAL | 3 refills | Status: DC

## 2021-03-17 MED ORDER — LOSARTAN POTASSIUM-HCTZ 100-12.5 MG PO TABS: 1.0000 | ORAL_TABLET | Freq: Every day | ORAL | 0 refills | Status: DC

## 2021-03-17 NOTE — Patient Instructions (Signed)
Mediterranean Diet A Mediterranean diet refers to food and lifestyle choices that are based on the traditions of countries located on the [place]. This way of eating has been shown to help prevent certain conditions and improve outcomes for people who have chronic diseases, like kidney disease and heart disease. What are tips for following this plan? Lifestyle  Cook and eat meals together with your family, when possible.  Drink enough fluid to keep your urine clear or pale yellow.  Be physically active every day. This includes: ? Aerobic exercise like running or swimming. ? Leisure activities like gardening, walking, or housework.  Get 7-8 hours of sleep each night.  If recommended by your health care provider, drink red wine in moderation. This means 1 glass a day for nonpregnant women and 2 glasses a day for men. A glass of wine equals 5 oz (150 mL). Reading food labels  Check the serving size of packaged foods. For foods such as rice and pasta, the serving size refers to the amount of cooked product, not dry.  Check the total fat in packaged foods. Avoid foods that have saturated fat or trans fats.  Check the ingredients list for added sugars, such as corn syrup.   Shopping  At the grocery store, buy most of your food from the areas near the walls of the store. This includes: ? Fresh fruits and vegetables (produce). ? Grains, beans, nuts, and seeds. Some of these may be available in unpackaged forms or large amounts (in bulk). ? Fresh seafood. ? Poultry and eggs. ? Low-fat dairy products.  Buy whole ingredients instead of prepackaged foods.  Buy fresh fruits and vegetables in-season from local farmers markets.  Buy frozen fruits and vegetables in resealable bags.  If you do not have access to quality fresh seafood, buy precooked frozen shrimp or canned fish, such as tuna, salmon, or sardines.  Buy small amounts of raw or cooked vegetables, salads, or olives from  the deli or salad bar at your store.  Stock your pantry so you always have certain foods on hand, such as olive oil, canned tuna, canned tomatoes, rice, pasta, and beans. Cooking  Cook foods with extra-virgin olive oil instead of using butter or other vegetable oils.  Have meat as a side dish, and have vegetables or grains as your main dish. This means having meat in small portions or adding small amounts of meat to foods like pasta or stew.  Use beans or vegetables instead of meat in common dishes like chili or lasagna.  Experiment with different cooking methods. Try roasting or broiling vegetables instead of steaming or sauteing them.  Add frozen vegetables to soups, stews, pasta, or rice.  Add nuts or seeds for added healthy fat at each meal. You can add these to yogurt, salads, or vegetable dishes.  Marinate fish or vegetables using olive oil, lemon juice, garlic, and fresh herbs. Meal planning  Plan to eat 1 vegetarian meal one day each week. Try to work up to 2 vegetarian meals, if possible.  Eat seafood 2 or more times a week.  Have healthy snacks readily available, such as: ? Vegetable sticks with hummus. ? Greek yogurt. ? Fruit and nut trail mix.  Eat balanced meals throughout the week. This includes: ? Fruit: 2-3 servings a day ? Vegetables: 4-5 servings a day ? Low-fat dairy: 2 servings a day ? Fish, poultry, or lean meat: 1 serving a day ? Beans and legumes: 2 or more servings a week ?   Nuts and seeds: 1-2 servings a day ? Whole grains: 6-8 servings a day ? Extra-virgin olive oil: 3-4 servings a day  Limit red meat and sweets to only a few servings a month   What are my food choices?  Mediterranean diet ? Recommended  Grains: Whole-grain pasta. Brown rice. Bulgar wheat. Polenta. Couscous. Whole-wheat bread. Oatmeal. Quinoa.  Vegetables: Artichokes. Beets. Broccoli. Cabbage. Carrots. Eggplant. Green beans. Chard. Kale. Spinach. Onions. Leeks. Peas. Squash.  Tomatoes. Peppers. Radishes.  Fruits: Apples. Apricots. Avocado. Berries. Bananas. Cherries. Dates. Figs. Grapes. Lemons. Melon. Oranges. Peaches. Plums. Pomegranate.  Meats and other protein foods: Beans. Almonds. Sunflower seeds. Pine nuts. Peanuts. Cod. Salmon. Scallops. Shrimp. Tuna. Tilapia. Clams. Oysters. Eggs.  Dairy: Low-fat milk. Cheese. Greek yogurt.  Beverages: Water. Red wine. Herbal tea.  Fats and oils: Extra virgin olive oil. Avocado oil. Grape seed oil.  Sweets and desserts: Greek yogurt with honey. Baked apples. Poached pears. Trail mix.  Seasoning and other foods: Basil. Cilantro. Coriander. Cumin. Mint. Parsley. Sage. Rosemary. Tarragon. Garlic. Oregano. Thyme. Pepper. Balsalmic vinegar. Tahini. Hummus. Tomato sauce. Olives. Mushrooms. ? Limit these  Grains: Prepackaged pasta or rice dishes. Prepackaged cereal with added sugar.  Vegetables: Deep fried potatoes (french fries).  Fruits: Fruit canned in syrup.  Meats and other protein foods: Beef. Pork. Lamb. Poultry with skin. Hot dogs. Bacon.  Dairy: Ice cream. Sour cream. Whole milk.  Beverages: Juice. Sugar-sweetened soft drinks. Beer. Liquor and spirits.  Fats and oils: Butter. Canola oil. Vegetable oil. Beef fat (tallow). Lard.  Sweets and desserts: Cookies. Cakes. Pies. Candy.  Seasoning and other foods: Mayonnaise. Premade sauces and marinades. The items listed may not be a complete list. Talk with your dietitian about what dietary choices are right for you. Summary  The Mediterranean diet includes both food and lifestyle choices.  Eat a variety of fresh fruits and vegetables, beans, nuts, seeds, and whole grains.  Limit the amount of red meat and sweets that you eat.  Talk with your health care provider about whether it is safe for you to drink red wine in moderation. This means 1 glass a day for nonpregnant women and 2 glasses a day for men. A glass of wine equals 5 oz (150 mL). This information  is not intended to replace advice given to you by your health care provider. Make sure you discuss any questions you have with your health care provider. Document Revised: 07/13/2016 Document Reviewed: 07/06/2016 Elsevier Patient Education  2020 Elsevier Inc[employer]  

## 2021-03-17 NOTE — Progress Notes (Signed)
Established Patient Office Visit  Subjective:  Patient ID: Courtney Fitzgerald, female    DOB: May 30, 1961  Age: 60 y.o. MRN: [id number]  CC:  Chief Complaint  Patient presents with  . Hypertension    2 week f/u     HPI Courtney Fitzgerald presents for HTN follow up. She was started on lisinopril 20 mg at her last visit. She reports doing well on this, although she has had some nausea intermittently throughout the day since starting it. It generally only lasts for a few minutes. She has a dental appointment today and is hoping they will schedule her surgery now that her BP is better controlled. She would like to know what she can do to lower her cholesterol. She reports that her diet needs a lot of work.     Past Medical History:  Diagnosis Date  . Allergy     Past Surgical History:  Procedure Laterality Date  . CESAREAN SECTION     1995  . CRYOABLATION    . ORIF ANKLE FRACTURE Left 02/28/2018   Procedure: OPEN REDUCTION INTERNAL FIXATION (ORIF) LEFT ANKLE FRACTURE;  Surgeon: Vickki Hearing, MD;  Location: [facility];  Service: Orthopedics;  Laterality: Left;    Family History  Problem Relation Age of Onset  . Cancer Mother        lung  . Hearing loss Mother   . Cancer Father        lung  . Heart disease Father   . Heart attack Father   . Hypertension Father   . Cancer Maternal Grandmother        stomach  . Diabetes Maternal Grandfather   . Cancer Paternal Grandmother        lung  . Heart attack Paternal Grandmother     Social History   Socioeconomic History  . Marital status: Legally Separated    Spouse name: Not on file  . Number of children: 2  . Years of education: 18  . Highest education level: High school graduate  Occupational History    Employer: [employer]  Tobacco Use  . Smoking status: Current Every Day Smoker    Packs/day: 0.50    Years: 18.00    Pack years: 9.00    Types: Cigarettes  . Smokeless tobacco: Never Used  Vaping Use  . Vaping Use: Never used   Substance and Sexual Activity  . Alcohol use: Yes    Alcohol/week: 15.0 standard drinks    Types: 15 Cans of beer per week  . Drug use: Not Currently  . Sexual activity: Not Currently    Birth control/protection: Post-menopausal  Other Topics Concern  . Not on file  Social History Narrative  . Not on file   Social Determinants of Health   Financial Resource Strain: Not on file  Food Insecurity: Not on file  Transportation Needs: Not on file  Physical Activity: Not on file  Stress: Not on file  Social Connections: Not on file  Intimate Partner Violence: Not on file    Outpatient Medications Prior to Visit  Medication Sig Dispense Refill  . ibuprofen (ADVIL,MOTRIN) 800 MG tablet TAKE 1 TABLET BY MOUTH EVERY 8 HOURS AS NEEDED 90 tablet 0  . lisinopril (ZESTRIL) 20 MG tablet Take 1 tablet (20 mg total) by mouth daily. 90 tablet 3   No facility-administered medications prior to visit.    Allergies  Allergen Reactions  . Prednisone Nausea And Vomiting    ROS Review of Systems Negative unless  specially indicated above in HPI.   Objective:    Physical Exam Constitutional:      General: She is not in acute distress.    Appearance: Normal appearance. She is not ill-appearing, toxic-appearing or diaphoretic.  HENT:     Head: Normocephalic and atraumatic.  Eyes:     Extraocular Movements: Extraocular movements intact.     Pupils: Pupils are equal, round, and reactive to light.  Cardiovascular:     Rate and Rhythm: Normal rate and regular rhythm.     Heart sounds: Normal heart sounds. No murmur heard.   Pulmonary:     Effort: Pulmonary effort is normal. No respiratory distress.     Breath sounds: Normal breath sounds.  Musculoskeletal:     Right lower leg: No edema.     Left lower leg: No edema.  Skin:    General: Skin is warm and dry.  Neurological:     General: No focal deficit present.     Mental Status: She is alert and oriented to person, place, and time.   Psychiatric:        Mood and Affect: Mood normal.        Behavior: Behavior normal.     BP (!) 143/73   Pulse 78   Temp 98.4 F (36.9 C)   Resp 20   Ht 5' 6.7" (1.694 m)   Wt 177 lb (80.3 kg)   SpO2 99%   BMI 27.97 kg/m  Wt Readings from Last 3 Encounters:  03/17/21 177 lb (80.3 kg)  02/25/21 175 lb 2 oz (79.4 kg)  07/09/18 188 lb (85.3 kg)     Health Maintenance Due  Topic Date Due  . PAP SMEAR-Modifier  Never done  . MAMMOGRAM  Never done    There are no preventive care reminders to display for this patient.  Lab Results  Component Value Date   TSH 1.050 02/25/2021   Lab Results  Component Value Date   WBC 6.6 02/25/2021   HGB 14.4 02/25/2021   HCT 41.7 02/25/2021   MCV 92 02/25/2021   PLT 261 02/25/2021   Lab Results  Component Value Date   NA 138 02/25/2021   K 4.5 02/25/2021   CO2 25 02/25/2021   GLUCOSE 97 02/25/2021   BUN 13 02/25/2021   CREATININE 0.80 02/25/2021   BILITOT 0.5 02/25/2021   ALKPHOS 95 02/25/2021   AST 23 02/25/2021   ALT 15 02/25/2021   PROT 7.1 02/25/2021   ALBUMIN 4.3 02/25/2021   CALCIUM 10.1 02/25/2021   ANIONGAP 12 02/27/2018   Lab Results  Component Value Date   CHOL 243 (H) 02/25/2021   Lab Results  Component Value Date   HDL 106 02/25/2021   Lab Results  Component Value Date   LDLCALC 122 (H) 02/25/2021   Lab Results  Component Value Date   TRIG 90 02/25/2021   Lab Results  Component Value Date   CHOLHDL 2.3 02/25/2021   No results found for: HGBA1C    Assessment & Plan:   Courtney Fitzgerald was seen today for hypertension.  Diagnoses and all orders for this visit:  Primary hypertension Better controlled but not quite at goal. Discontinue lisinopril due to reports of nausea since starting. Start Hyzaar as below. . -     losartan-hydrochlorothiazide (HYZAAR) 100-12.5 MG tablet; Take 1 tablet by mouth daily.  Pure hyperlipidemia Discussed diet changes. Patient will try an OTC fish oil with diet changes.  Will recheck labs in a few months and  discuss statin therapy if not controlled with diet.    Follow-up: Return in about 4 weeks (around 04/14/2021) for HTN.  The patient indicates understanding of these issues and agrees with the plan.    Gabriel Earing, FNP

## 2021-04-14 ENCOUNTER — Other Ambulatory Visit: Payer: Self-pay

## 2021-04-14 ENCOUNTER — Encounter: Payer: Self-pay | Admitting: Family Medicine

## 2021-04-14 ENCOUNTER — Ambulatory Visit (INDEPENDENT_AMBULATORY_CARE_PROVIDER_SITE_OTHER): Payer: BC Managed Care – PPO | Admitting: Family Medicine

## 2021-04-14 VITALS — BP 114/68 | HR 74 | Temp 97.8°F | Ht 66.75 in | Wt 175.2 lb

## 2021-04-14 DIAGNOSIS — I10 Essential (primary) hypertension: Secondary | ICD-10-CM | POA: Diagnosis not present

## 2021-04-14 NOTE — Patient Instructions (Signed)

## 2021-04-14 NOTE — Progress Notes (Signed)
Established Patient Office Visit  Subjective:  Patient ID: Courtney Fitzgerald, female    DOB: 1961/03/14  Age: 60 y.o. MRN: [id number]  CC:  Chief Complaint  Patient presents with  . Hypertension    HPI Courtney Fitzgerald presents for for HTN follow up.  1. HTN Complaint with meds - has been cutting her pill in half and taking it twice a day- denies nausea this way Current Medications - losartan/HCTZ  Exercising Regularly - active at work Pertinent ROS:  Headache - No Fatigue - No Visual Disturbances - No Chest pain - No Dyspnea - No Palpitations - No LE edema - No They report good compliance with medications and can restate their regimen by memory. No medication side effects.  Family, social, and smoking history reviewed.   BP Readings from Last 3 Encounters:  04/14/21 114/68  03/17/21 (!) 143/73  02/25/21 (!) 177/84   CMP Latest Ref Rng & Units 02/25/2021 02/27/2018  Glucose 65 - 99 mg/dL 97 116(H)  BUN 6 - 24 mg/dL 13 11  Creatinine 0.57 - 1.00 mg/dL 0.80 0.64  Sodium 134 - 144 mmol/L 138 139  Potassium 3.5 - 5.2 mmol/L 4.5 3.6  Chloride 96 - 106 mmol/L 100 101  CO2 20 - 29 mmol/L 25 26  Calcium 8.7 - 10.2 mg/dL 10.1 9.2  Total Protein 6.0 - 8.5 g/dL 7.1 -  Total Bilirubin 0.0 - 1.2 mg/dL 0.5 -  Alkaline Phos 44 - 121 IU/L 95 -  AST 0 - 40 IU/L 23 -  ALT 0 - 32 IU/L 15 -      Past Medical History:  Diagnosis Date  . Allergy     Past Surgical History:  Procedure Laterality Date  . CESAREAN SECTION     1995  . CRYOABLATION    . ORIF ANKLE FRACTURE Left 02/28/2018   Procedure: OPEN REDUCTION INTERNAL FIXATION (ORIF) LEFT ANKLE FRACTURE;  Surgeon: Carole Civil, MD;  Location: [facility];  Service: Orthopedics;  Laterality: Left;    Family History  Problem Relation Age of Onset  . Cancer Mother        lung  . Hearing loss Mother   . Cancer Father        lung  . Heart disease Father   . Heart attack Father   . Hypertension Father   . Cancer Maternal Grandmother         stomach  . Diabetes Maternal Grandfather   . Cancer Paternal Grandmother        lung  . Heart attack Paternal Grandmother     Social History   Socioeconomic History  . Marital status: Legally Separated    Spouse name: Not on file  . Number of children: 2  . Years of education: 54  . Highest education level: High school graduate  Occupational History    Employer: [employer]  Tobacco Use  . Smoking status: Current Every Day Smoker    Packs/day: 0.50    Years: 18.00    Pack years: 9.00    Types: Cigarettes  . Smokeless tobacco: Never Used  Vaping Use  . Vaping Use: Never used  Substance and Sexual Activity  . Alcohol use: Yes    Alcohol/week: 15.0 standard drinks    Types: 15 Cans of beer per week  . Drug use: Not Currently  . Sexual activity: Not Currently    Birth control/protection: Post-menopausal  Other Topics Concern  . Not on file  Social History Narrative  .  Not on file   Social Determinants of Health   Financial Resource Strain: Not on file  Food Insecurity: Not on file  Transportation Needs: Not on file  Physical Activity: Not on file  Stress: Not on file  Social Connections: Not on file  Intimate Partner Violence: Not on file    Outpatient Medications Prior to Visit  Medication Sig Dispense Refill  . acetaminophen (TYLENOL) 500 MG tablet Take 500 mg by mouth every 6 (six) hours as needed.    Marland Kitchen ibuprofen (ADVIL) 600 MG tablet Take 600 mg by mouth every 6 (six) hours as needed.    Marland Kitchen losartan-hydrochlorothiazide (HYZAAR) 100-12.5 MG tablet Take 1 tablet by mouth daily. (Patient taking differently: Take 0.5 tablets by mouth in the morning and at bedtime.) 90 tablet 3  . ibuprofen (ADVIL,MOTRIN) 800 MG tablet TAKE 1 TABLET BY MOUTH EVERY 8 HOURS AS NEEDED 90 tablet 0   No facility-administered medications prior to visit.    Allergies  Allergen Reactions  . Prednisone Nausea And Vomiting    ROS Review of Systems As per HPI.    Objective:     Physical Exam Vitals and nursing note reviewed.  Constitutional:      General: She is not in acute distress.    Appearance: Normal appearance. She is not ill-appearing, toxic-appearing or diaphoretic.  Cardiovascular:     Rate and Rhythm: Normal rate and regular rhythm.     Heart sounds: Normal heart sounds. No murmur heard.   Pulmonary:     Effort: Pulmonary effort is normal. No respiratory distress.     Breath sounds: Normal breath sounds.  Musculoskeletal:     Right lower leg: No edema.     Left lower leg: No edema.  Skin:    General: Skin is warm and dry.  Neurological:     General: No focal deficit present.     Mental Status: She is alert and oriented to person, place, and time.  Psychiatric:        Mood and Affect: Mood normal.        Behavior: Behavior normal.     BP 114/68   Pulse 74   Temp 97.8 F (36.6 C) (Temporal)   Ht 5' 6.75" (1.695 m)   Wt 175 lb 4 oz (79.5 kg)   BMI 27.65 kg/m  Wt Readings from Last 3 Encounters:  04/14/21 175 lb 4 oz (79.5 kg)  03/17/21 177 lb (80.3 kg)  02/25/21 175 lb 2 oz (79.4 kg)     Health Maintenance Due  Topic Date Due  . PAP SMEAR-Modifier  Never done  . MAMMOGRAM  Never done    There are no preventive care reminders to display for this patient.  Lab Results  Component Value Date   TSH 1.050 02/25/2021   Lab Results  Component Value Date   WBC 6.6 02/25/2021   HGB 14.4 02/25/2021   HCT 41.7 02/25/2021   MCV 92 02/25/2021   PLT 261 02/25/2021   Lab Results  Component Value Date   NA 138 02/25/2021   K 4.5 02/25/2021   CO2 25 02/25/2021   GLUCOSE 97 02/25/2021   BUN 13 02/25/2021   CREATININE 0.80 02/25/2021   BILITOT 0.5 02/25/2021   ALKPHOS 95 02/25/2021   AST 23 02/25/2021   ALT 15 02/25/2021   PROT 7.1 02/25/2021   ALBUMIN 4.3 02/25/2021   CALCIUM 10.1 02/25/2021   ANIONGAP 12 02/27/2018   EGFR 86 02/25/2021   Lab Results  Component Value Date   CHOL 243 (H) 02/25/2021   Lab Results   Component Value Date   HDL 106 02/25/2021   Lab Results  Component Value Date   LDLCALC 122 (H) 02/25/2021   Lab Results  Component Value Date   TRIG 90 02/25/2021   Lab Results  Component Value Date   CHOLHDL 2.3 02/25/2021   No results found for: HGBA1C    Assessment & Plan:   Courtney Fitzgerald was seen today for hypertension.  Diagnoses and all orders for this visit:  Primary hypertension Well controlled on current regimen on losartan/HCTZ.    Follow-up: Return in about 6 weeks (around 05/27/2021) for chronic follow up.   The patient indicates understanding of these issues and agrees with the plan.    Courtney Perking, FNP

## 2021-05-26 ENCOUNTER — Ambulatory Visit: Payer: BC Managed Care – PPO | Admitting: Family Medicine

## 2021-05-26 ENCOUNTER — Encounter: Payer: Self-pay | Admitting: Family Medicine

## 2021-07-15 DIAGNOSIS — H40033 Anatomical narrow angle, bilateral: Secondary | ICD-10-CM | POA: Diagnosis not present

## 2021-07-15 DIAGNOSIS — H04123 Dry eye syndrome of bilateral lacrimal glands: Secondary | ICD-10-CM | POA: Diagnosis not present

## 2021-09-26 ENCOUNTER — Telehealth: Payer: Self-pay | Admitting: *Deleted

## 2021-09-26 DIAGNOSIS — I10 Essential (primary) hypertension: Secondary | ICD-10-CM

## 2021-09-26 MED ORDER — LOSARTAN POTASSIUM-HCTZ 100-12.5 MG PO TABS: 1.0000 | ORAL_TABLET | Freq: Every day | ORAL | 1 refills | Status: DC

## 2021-09-26 NOTE — Telephone Encounter (Signed)
VM from pt, she needs to have the remaining refills on her Losartan-HCTZ sent to [facility] since she changed insurances and no longer uses that mail order pharmacy  Pt aware

## 2022-04-10 ENCOUNTER — Ambulatory Visit: Payer: BC Managed Care – PPO | Admitting: Family Medicine

## 2022-04-10 ENCOUNTER — Ambulatory Visit (INDEPENDENT_AMBULATORY_CARE_PROVIDER_SITE_OTHER): Payer: BC Managed Care – PPO | Admitting: Family Medicine

## 2022-04-10 ENCOUNTER — Encounter: Payer: Self-pay | Admitting: Family Medicine

## 2022-04-10 VITALS — BP 155/79 | HR 87 | Temp 98.5°F | Ht 66.75 in | Wt 165.5 lb

## 2022-04-10 DIAGNOSIS — E7800 Pure hypercholesterolemia, unspecified: Secondary | ICD-10-CM

## 2022-04-10 DIAGNOSIS — I10 Essential (primary) hypertension: Secondary | ICD-10-CM

## 2022-04-10 DIAGNOSIS — J301 Allergic rhinitis due to pollen: Secondary | ICD-10-CM

## 2022-04-10 DIAGNOSIS — M545 Low back pain, unspecified: Secondary | ICD-10-CM | POA: Diagnosis not present

## 2022-04-10 DIAGNOSIS — E663 Overweight: Secondary | ICD-10-CM

## 2022-04-10 MED ORDER — LEVOCETIRIZINE DIHYDROCHLORIDE 5 MG PO TABS: 5.0000 mg | ORAL_TABLET | Freq: Every evening | ORAL | 3 refills | Status: DC

## 2022-04-10 MED ORDER — FLUTICASONE PROPIONATE 50 MCG/ACT NA SUSP: 2.0000 | Freq: Every day | NASAL | 6 refills | Status: DC

## 2022-04-10 MED ORDER — KETOROLAC TROMETHAMINE 60 MG/2ML IM SOLN
60.0000 mg | Freq: Once | INTRAMUSCULAR | Status: AC
  Administered 2022-04-10: 60 mg via INTRAMUSCULAR

## 2022-04-10 MED ORDER — MELOXICAM 15 MG PO TABS: 15.0000 mg | ORAL_TABLET | Freq: Every day | ORAL | 0 refills | Status: DC

## 2022-04-10 MED ORDER — LOSARTAN POTASSIUM-HCTZ 100-25 MG PO TABS: 1.0000 | ORAL_TABLET | Freq: Every day | ORAL | 1 refills | Status: DC

## 2022-04-10 NOTE — Progress Notes (Signed)
? ?Established Patient Office Visit ? ?Subjective   ?Patient ID: Courtney Fitzgerald, female    DOB: 07-Apr-1961  Age: 61 y.o. MRN: [id number] ? ?Chief Complaint  ?Patient presents with  ? Medical Management of Chronic Issues  ? Hypertension  ? ? ?HPI ? ?HTN ?Complaint with meds - Yes ?Current Medications - hyzaar 100-12.5 ?Checking BP at home ranging 150s/70s ?Pertinent ROS:  ?Visual Disturbances - No ?Chest pain - No ?Dyspnea - No ?Palpitations - No ?LE edema - No ? ?2. Ear pain ?Left ear pain x 3 weeks off and on. No drainage or fever. Has had sneezing and runny nose as well. She has been taking clortab for her allergies. Intermittent sore throat.  ? ?3. Back pain ?Reports lower right sided back pain x 1 day. She was moving furniture yesterday with a lot of twisting or bending. Started with a back ache last night. She is now is having sharp pain with certain movements. The pain is moderate. She has taken tylenol with little improvement. Denies falls, saddle anesthesia, numbness, tingling, or changes in bowel or bladder control.  ? ? ?Patient Active Problem List  ? Diagnosis Date Noted  ? Primary hypertension 03/17/2021  ? Pure hypercholesterolemia 03/17/2021  ? S/P ORIF (open reduction internal fixation) fracture left ankle 02/28/18 03/13/2018  ? Trimalleolar fracture of ankle, closed, left, initial encounter 02/27/2018  ? ?  ? ?ROS ?As per HPI.  ?  ?Objective:  ?  ? ?BP (!) 155/79   Pulse 87   Temp 98.5 ?F (36.9 ?C) (Temporal)   Ht 5' 6.75" (1.695 m)   Wt 165 lb 8 oz (75.1 kg)   SpO2 97%   BMI 26.12 kg/m?  ?BP Readings from Last 3 Encounters:  ?04/10/22 (!) 155/79  ?04/14/21 114/68  ?03/17/21 (!) 143/73  ? ?  ? ?Physical Exam ?Vitals and nursing note reviewed.  ?Constitutional:   ?   General: She is not in acute distress. ?   Appearance: She is not ill-appearing, toxic-appearing or diaphoretic.  ?HENT:  ?   Head: Normocephalic and atraumatic.  ?   Right Ear: Ear canal and external ear normal. A middle ear effusion is  present. Tympanic membrane is not injected, scarred, perforated, erythematous, retracted or bulging.  ?   Left Ear: Ear canal and external ear normal. A middle ear effusion is present. Tympanic membrane is not injected, scarred, perforated, erythematous, retracted or bulging.  ?   Mouth/Throat:  ?   Mouth: Mucous membranes are moist.  ?   Pharynx: Oropharynx is clear. No oropharyngeal exudate or posterior oropharyngeal erythema.  ?Eyes:  ?   General: No scleral icterus.    ?   Right eye: No discharge.     ?   Left eye: No discharge.  ?   Extraocular Movements: Extraocular movements intact.  ?   Conjunctiva/sclera: Conjunctivae normal.  ?   Pupils: Pupils are equal, round, and reactive to light.  ?Cardiovascular:  ?   Rate and Rhythm: Normal rate and regular rhythm.  ?   Heart sounds: Normal heart sounds. No murmur heard. ?Pulmonary:  ?   Effort: Pulmonary effort is normal. No respiratory distress.  ?   Breath sounds: Normal breath sounds. No wheezing, rhonchi or rales.  ?Chest:  ?   Chest wall: No tenderness.  ?Abdominal:  ?   General: Bowel sounds are normal. There is no distension.  ?   Palpations: Abdomen is soft.  ?   Tenderness: There is no abdominal tenderness.  There is no guarding or rebound.  ?Musculoskeletal:  ?   Right lower leg: No edema.  ?   Left lower leg: No edema.  ?Skin: ?   General: Skin is warm and dry.  ?Neurological:  ?   General: No focal deficit present.  ?   Mental Status: She is alert and oriented to person, place, and time.  ?Psychiatric:     ?   Mood and Affect: Mood normal.     ?   Behavior: Behavior normal.     ?   Thought Content: Thought content normal.     ?   Judgment: Judgment normal.  ? ? ? ?No results found for any visits on 04/10/22. ? ?Last CBC ?Lab Results  ?Component Value Date  ? WBC 6.6 02/25/2021  ? HGB 14.4 02/25/2021  ? HCT 41.7 02/25/2021  ? MCV 92 02/25/2021  ? MCH 31.8 02/25/2021  ? RDW 12.2 02/25/2021  ? PLT 261 02/25/2021  ? ?Last metabolic panel ?Lab Results   ?Component Value Date  ? GLUCOSE 97 02/25/2021  ? NA 138 02/25/2021  ? K 4.5 02/25/2021  ? CL 100 02/25/2021  ? CO2 25 02/25/2021  ? BUN 13 02/25/2021  ? CREATININE 0.80 02/25/2021  ? EGFR 86 02/25/2021  ? CALCIUM 10.1 02/25/2021  ? PROT 7.1 02/25/2021  ? ALBUMIN 4.3 02/25/2021  ? LABGLOB 2.8 02/25/2021  ? AGRATIO 1.5 02/25/2021  ? BILITOT 0.5 02/25/2021  ? ALKPHOS 95 02/25/2021  ? AST 23 02/25/2021  ? ALT 15 02/25/2021  ? ANIONGAP 12 02/27/2018  ? ?Last lipids ?Lab Results  ?Component Value Date  ? CHOL 243 (H) 02/25/2021  ? HDL 106 02/25/2021  ? LDLCALC 122 (H) 02/25/2021  ? TRIG 90 02/25/2021  ? CHOLHDL 2.3 02/25/2021  ? ?  ? ?The ASCVD Risk score (Arnett DK, et al., 2019) failed to calculate for the following reasons: ?  The valid HDL cholesterol range is 20 to 100 mg/dL ? ?  ?Assessment & Plan:  ? ?Courtney Fitzgerald was seen today for medical management of chronic issues and hypertension. ? ?Diagnoses and all orders for this visit: ? ?Primary hypertension ?Not at goal. Will increase HCTZ in combo pill to 25 mg. Labs pending. Monitor BP at home and notify for persistently elevated readings. Asymptomatic today.  ?-     losartan-hydrochlorothiazide (HYZAAR) 100-25 MG tablet; Take 1 tablet by mouth daily. ?-     CBC with Differential/Platelet ?-     CMP14+EGFR ?-     Lipid panel ? ?Pure hypercholesterolemia ?Diet and exercise. Labs pending.  ?-     CBC with Differential/Platelet ?-     CMP14+EGFR ?-     Lipid panel ? ?Overweight ?Diet and exercise.  ? ?Seasonal allergic rhinitis due to pollen ?Change to xyzal and add flonase.  ?-     levocetirizine (XYZAL) 5 MG tablet; Take 1 tablet (5 mg total) by mouth every evening. ?-     fluticasone (FLONASE) 50 MCG/ACT nasal spray; Place 2 sprays into both nostrils daily. ? ?Acute right-sided low back pain without sciatica ?Toradol injection today in office. Start [employer]. Stretching, tylenol, heat, ice, rest.  ?-     meloxicam (MOBIC) 15 MG tablet; Take 1 tablet (15 mg total)  by mouth daily. ?-     ketorolac (TORADOL) injection 60 mg ? ?Patient has an appt later this week for positive PHQ.  ? ?Return in about 3 months (around 07/11/2022) for chronic follow up.  ? ? ?  Gwenlyn Perking, FNP ? ?

## 2022-04-10 NOTE — Patient Instructions (Addendum)
Start meloxicam tomorrow for back pain.  ? ?Call if BP is > 140/90s after 1 week.  ? ?Lumbar Strain ?A lumbar strain, which is sometimes called a low-back strain, is a stretch or tear in a muscle or the strong cords of tissue that attach muscle to bone (tendons) in the lower back (lumbar spine). This type of injury occurs when muscles or tendons are torn or are stretched beyond their limits. ?Lumbar strains can range from mild to severe. Mild strains may involve stretching a muscle or tendon without tearing it. These may heal in 1-2 weeks. More severe strains involve tearing of muscle fibers or tendons. These will cause more pain and may take 6-8 weeks to heal. ?What are the causes? ?This condition may be caused by: ?Trauma, such as a fall or a hit to the body. ?Twisting or overstretching the back. This may result from doing activities that need a lot of energy, such as lifting heavy objects. ?What increases the risk? ?This injury is more common in: ?Athletes. ?People with obesity. ?People who do repeated lifting, bending, or other movements that involve their back. ?What are the signs or symptoms? ?Symptoms of this condition may include: ?Sharp or dull pain in the lower back that does not go away. The pain may extend to the buttocks. ?Stiffness or limited range of motion. ?Sudden muscle tightening (spasms). ?How is this diagnosed? ?This condition may be diagnosed based on: ?Your symptoms. ?Your medical history. ?A physical exam. ?Imaging tests, such as: ?X-rays. ?MRI. ?How is this treated? ?Treatment for this condition may include: ?Rest. ?Applying heat and cold to the affected area. ?Over-the-counter medicines to help relieve pain and inflammation, such as NSAIDs. ?Prescription pain medicine and muscle relaxants may be needed for a short time. ?Physical therapy. ?Follow these instructions at home: ?Managing pain, stiffness, and swelling ? ?  ? ?If directed, put ice on the injured area during the first 24 hours  after your injury. ?Put ice in a plastic bag. ?Place a towel between your skin and the bag. ?Leave the ice on for 20 minutes, 2-3 times a day. ?If directed, apply heat to the affected area as often as told by your health care provider. Use the heat source that your health care provider recommends, such as a moist heat pack or a heating pad. ?Place a towel between your skin and the heat source. ?Leave the heat on for 20-30 minutes. ?Remove the heat if your skin turns bright red. This is especially important if you are unable to feel pain, heat, or cold. You may have a greater risk of getting burned. ?Activity ?Rest and return to your normal activities as told by your health care provider. Ask your health care provider what activities are safe for you. ?Do exercises as told by your health care provider. ?Medicines ?Take over-the-counter and prescription medicines only as told by your health care provider. ?Ask your health care provider if the medicine prescribed to you: ?Requires you to avoid driving or using heavy machinery. ?Can cause constipation. You may need to take these actions to prevent or treat constipation: ?Drink enough fluid to keep your urine pale yellow. ?Take over-the-counter or prescription medicines. ?Eat foods that are high in fiber, such as beans, whole grains, and fresh fruits and vegetables. ?Limit foods that are high in fat and processed sugars, such as fried or sweet foods. ?Injury prevention ?To prevent a future low-back injury: ?Always warm up properly before physical activity or sports. ?Cool down and stretch after  being active. ?Use correct form when playing sports and lifting heavy objects. Bend your knees before you lift heavy objects. ?Use good posture when sitting and standing. ?Stay physically fit and keep a healthy weight. ?Do at least 150 minutes of moderate-intensity exercise each week, such as brisk walking or water aerobics. ?Do strength exercises at least 2 times each  week. ? ?General instructions ?Do not use any products that contain nicotine or tobacco, such as cigarettes, e-cigarettes, and chewing tobacco. If you need help quitting, ask your health care provider. ?Keep all follow-up visits as told by your health care provider. This is important. ?Contact a health care provider if: ?Your back pain does not improve after 6 weeks of treatment. ?Your symptoms get worse. ?Get help right away if: ?Your back pain is severe. ?You are unable to stand or walk. ?You develop pain in your legs. ?You develop weakness in your buttocks or legs. ?You have difficulty controlling when you urinate or when you have a bowel movement. ?You have frequent, painful, or bloody urination. ?You have a temperature over 101.0?F (38.3?C) ?Summary ?A lumbar strain, which is sometimes called a low-back strain, is a stretch or tear in a muscle or the strong cords of tissue that attach muscle to bone (tendons) in the lower back (lumbar spine). ?This type of injury occurs when muscles or tendons are torn or are stretched beyond their limits. ?Rest and return to your normal activities as told by your health care provider. If directed, apply heat and ice to the affected area as often as told by your health care provider. ?Take over-the-counter and prescription medicines only as told by your health care provider. ?Contact a health care provider if you have new or worsening symptoms. ?This information is not intended to replace advice given to you by your health care provider. Make sure you discuss any questions you have with your health care provider. ?Document Revised: 09/15/2021 Document Reviewed: 09/15/2021 ?Elsevier Patient Education ? 2023 Elsevier Inc[employer] ? ?

## 2022-04-11 LAB — CMP14+EGFR
ALT: 13 IU/L (ref 0–32)
AST: 19 IU/L (ref 0–40)
Albumin/Globulin Ratio: 1.7 (ref 1.2–2.2)
Albumin: 4.5 g/dL (ref 3.8–4.9)
Alkaline Phosphatase: 104 IU/L (ref 44–121)
BUN/Creatinine Ratio: 12 (ref 9–23)
BUN: 12 mg/dL (ref 6–24)
Bilirubin Total: 0.3 mg/dL (ref 0.0–1.2)
CO2: 23 mmol/L (ref 20–29)
Calcium: 9.7 mg/dL (ref 8.7–10.2)
Chloride: 93 mmol/L — ABNORMAL LOW (ref 96–106)
Creatinine, Ser: 0.99 mg/dL (ref 0.57–1.00)
Globulin, Total: 2.6 g/dL (ref 1.5–4.5)
Glucose: 108 mg/dL — ABNORMAL HIGH (ref 70–99)
Potassium: 4.1 mmol/L (ref 3.5–5.2)
Sodium: 133 mmol/L — ABNORMAL LOW (ref 134–144)
Total Protein: 7.1 g/dL (ref 6.0–8.5)
eGFR: 66 mL/min/{1.73_m2} (ref >59)

## 2022-04-11 LAB — LIPID PANEL
Chol/HDL Ratio: 2.1 ratio (ref 0.0–4.4)
Cholesterol, Total: 255 mg/dL — ABNORMAL HIGH (ref 100–199)
HDL: 119 mg/dL (ref >39)
LDL Chol Calc (NIH): 115 mg/dL — ABNORMAL HIGH (ref 0–99)
Triglycerides: 127 mg/dL (ref 0–149)
VLDL Cholesterol Cal: 21 mg/dL (ref 5–40)

## 2022-04-11 LAB — CBC WITH DIFFERENTIAL/PLATELET
Basophils Absolute: 0.1 10*3/uL (ref 0.0–0.2)
Basos: 1 %
EOS (ABSOLUTE): 0.1 10*3/uL (ref 0.0–0.4)
Eos: 1 %
Hematocrit: 36.3 % (ref 34.0–46.6)
Hemoglobin: 12.8 g/dL (ref 11.1–15.9)
Immature Grans (Abs): 0 10*3/uL (ref 0.0–0.1)
Immature Granulocytes: 0 %
Lymphocytes Absolute: 1.6 10*3/uL (ref 0.7–3.1)
Lymphs: 21 %
MCH: 32.1 pg (ref 26.6–33.0)
MCHC: 35.3 g/dL (ref 31.5–35.7)
MCV: 91 fL (ref 79–97)
Monocytes Absolute: 0.5 10*3/uL (ref 0.1–0.9)
Monocytes: 6 %
Neutrophils Absolute: 5.6 10*3/uL (ref 1.4–7.0)
Neutrophils: 71 %
Platelets: 353 10*3/uL (ref 150–450)
RBC: 3.99 x10E6/uL (ref 3.77–5.28)
RDW: 12.3 % (ref 11.7–15.4)
WBC: 7.8 10*3/uL (ref 3.4–10.8)

## 2022-04-12 ENCOUNTER — Other Ambulatory Visit: Payer: Self-pay | Admitting: Family Medicine

## 2022-04-12 DIAGNOSIS — E7800 Pure hypercholesterolemia, unspecified: Secondary | ICD-10-CM

## 2022-04-12 MED ORDER — ATORVASTATIN CALCIUM 20 MG PO TABS: 20.0000 mg | ORAL_TABLET | Freq: Every day | ORAL | 3 refills | Status: DC

## 2022-04-13 ENCOUNTER — Encounter: Payer: Self-pay | Admitting: Family Medicine

## 2022-04-13 ENCOUNTER — Ambulatory Visit (INDEPENDENT_AMBULATORY_CARE_PROVIDER_SITE_OTHER): Payer: BC Managed Care – PPO | Admitting: Family Medicine

## 2022-04-13 DIAGNOSIS — E7800 Pure hypercholesterolemia, unspecified: Secondary | ICD-10-CM | POA: Diagnosis not present

## 2022-04-13 DIAGNOSIS — F419 Anxiety disorder, unspecified: Secondary | ICD-10-CM | POA: Diagnosis not present

## 2022-04-13 DIAGNOSIS — F330 Major depressive disorder, recurrent, mild: Secondary | ICD-10-CM

## 2022-04-13 MED ORDER — CITALOPRAM HYDROBROMIDE 20 MG PO TABS: 20.0000 mg | ORAL_TABLET | Freq: Every day | ORAL | 3 refills | Status: DC

## 2022-04-13 NOTE — Patient Instructions (Signed)

## 2022-04-13 NOTE — Progress Notes (Signed)
Virtual Visit  Note Due to COVID-19 pandemic this visit was conducted virtually. This visit type was conducted due to national recommendations for restrictions regarding the COVID-19 Pandemic (e.g. social distancing, sheltering in place) in an effort to limit this patient's exposure and mitigate transmission in our community. All issues noted in this document were discussed and addressed.  A physical exam was not performed with this format.  I connected with Courtney Fitzgerald on 04/13/22 at 1628 by telephone and verified that I am speaking with the correct person using two identifiers. Courtney Fitzgerald is currently located at home and no one is currently with her during the visit. The provider, Gabriel Earing, FNP is located in their office at time of visit.  I discussed the limitations, risks, security and privacy concerns of performing an evaluation and management service by telephone and the availability of in person appointments. I also discussed with the patient that there may be a patient responsible charge related to this service. The patient expressed understanding and agreed to proceed.  CC: mood  History and Present Illness:  HPI Courtney Fitzgerald reports irritability that has been worsening over the last few years. She reports getting emotional easily and this is affecting her work. She used to take medication about 20 years ago but has not since then. She completed a PHQ and GAD a few days ago while she was in the office. She denies SI, plan, or intent. She is interested in taking medication again.      04/10/2022   10:25 AM 04/14/2021    8:10 AM 03/17/2021    8:05 AM  Depression screen PHQ 2/9  Decreased Interest 0 0 0  Down, Depressed, Hopeless 1 0 0  PHQ - 2 Score 1 0 0  Altered sleeping 2 1   Tired, decreased energy 1 1   Change in appetite 0 1   Feeling bad or failure about yourself  1 0   Trouble concentrating 0 0   Moving slowly or fidgety/restless 0 0   Suicidal thoughts 0 0   PHQ-9 Score  5 3   Difficult doing work/chores Somewhat difficult Not difficult at all       04/10/2022   10:27 AM 04/14/2021    8:10 AM  GAD 7 : Generalized Anxiety Score  Nervous, Anxious, on Edge 0 0  Control/stop worrying 2 1  Worry too much - different things 2 1  Trouble relaxing 0 0  Restless 0 0  Easily annoyed or irritable 3 0  Afraid - awful might happen 0 0  Total GAD 7 Score 7 2  Anxiety Difficulty Somewhat difficult Not difficult at all     ROS Negative unless specially indicated above in HPI.  Observations/Objective: Alert and oriented x 3. Able to speak in full sentences without difficulty.    Assessment and Plan: Courtney Fitzgerald was seen today for anxiety.  Diagnoses and all orders for this visit:  Mild recurrent major depression (HCC) Anxiety Start celexa as below. Discussed side effects. Follow up in 6 weeks, sooner for side effects, new, or worsening symptoms.  -     citalopram (CELEXA) 20 MG tablet; Take 1 tablet (20 mg total) by mouth daily.  Pure hypercholesterolemia Discussed lab results with patient. Atorvastatin has been sent in. She will pick this up and start on this. Will recheck lipid panel in 3 months.    Follow Up Instructions: Return in about 6 weeks (around 05/25/2022) for medication follow up.    I discussed  the assessment and treatment plan with the patient. The patient was provided an opportunity to ask questions and all were answered. The patient agreed with the plan and demonstrated an understanding of the instructions.   The patient was advised to call back or seek an in-person evaluation if the symptoms worsen or if the condition fails to improve as anticipated.  The above assessment and management plan was discussed with the patient. The patient verbalized understanding of and has agreed to the management plan. Patient is aware to call the clinic if symptoms persist or worsen. Patient is aware when to return to the clinic for a follow-up visit. Patient  educated on when it is appropriate to go to the emergency department.   Time call ended:  1648  I provided 20 minutes of  non face-to-face time during this encounter.    Gabriel Earing, FNP

## 2022-04-14 LAB — SPECIMEN STATUS REPORT

## 2022-04-14 LAB — HGB A1C W/O EAG: Hgb A1c MFr Bld: 5.5 % (ref 4.8–5.6)

## 2022-07-04 ENCOUNTER — Encounter: Payer: Self-pay | Admitting: Nurse Practitioner

## 2022-07-04 ENCOUNTER — Ambulatory Visit (INDEPENDENT_AMBULATORY_CARE_PROVIDER_SITE_OTHER): Payer: BC Managed Care – PPO | Admitting: Nurse Practitioner

## 2022-07-04 VITALS — BP 155/68 | HR 81 | Temp 98.6°F | Ht 66.0 in | Wt 164.0 lb

## 2022-07-04 DIAGNOSIS — R5383 Other fatigue: Secondary | ICD-10-CM

## 2022-07-04 NOTE — Progress Notes (Signed)
Acute Office Visit  Subjective:     Patient ID: Courtney Fitzgerald, female    DOB: [birth year], 60 y.o.   MRN: [id number]  Chief Complaint  Patient presents with   Dizziness    States this started Friday , when she would stand up it would be really bad , also made her where she could barley walk   Fatigue    Started Thursday night    Extremity Weakness    Dizziness This is a new problem. The current episode started yesterday. The problem occurs intermittently. The problem has been gradually improving. Associated symptoms include fatigue. Pertinent negatives include no chest pain, chills or headaches. She has tried nothing for the symptoms.  Extremity Weakness  This is a new problem. The current episode started yesterday. The problem occurs intermittently. The problem has been unchanged. The patient is experiencing no pain. The symptoms are aggravated by activity. She has tried nothing for the symptoms.     Review of Systems  Constitutional:  Positive for fatigue and malaise/fatigue. Negative for chills.  Eyes: Negative.   Respiratory: Negative.    Cardiovascular: Negative.  Negative for chest pain.  Genitourinary: Negative.   Musculoskeletal:  Positive for extremity weakness.  Skin: Negative.   Neurological:  Positive for dizziness. Negative for headaches.  All other systems reviewed and are negative.       Objective:    BP (!) 155/68   Pulse 81   Temp 98.6 F (37 C)   Ht 5\' 6"  (1.676 m)   Wt 164 lb (74.4 kg)   SpO2 100%   BMI 26.47 kg/m  BP Readings from Last 3 Encounters:  07/04/22 (!) 155/68  04/10/22 (!) 155/79  04/14/21 114/68   Wt Readings from Last 3 Encounters:  07/04/22 164 lb (74.4 kg)  04/10/22 165 lb 8 oz (75.1 kg)  04/14/21 175 lb 4 oz (79.5 kg)      Physical Exam Vitals and nursing note reviewed.  Constitutional:      Appearance: Normal appearance.  HENT:     Head: Normocephalic.     Right Ear: External ear normal.     Left Ear: External ear  normal.  Eyes:     Conjunctiva/sclera: Conjunctivae normal.  Cardiovascular:     Rate and Rhythm: Normal rate and regular rhythm.     Pulses: Normal pulses.     Heart sounds: Normal heart sounds.  Pulmonary:     Effort: Pulmonary effort is normal.     Breath sounds: Normal breath sounds.  Abdominal:     General: Bowel sounds are normal.  Musculoskeletal:        General: Normal range of motion.  Skin:    General: Skin is warm.  Neurological:     General: No focal deficit present.     Mental Status: She is alert and oriented to person, place, and time.     No results found for any visits on 07/04/22.      Assessment & Plan:  Patient is here today with complaints of fatigue and weakness in the past few days.  Patient recently increased her losartan and was supposed to follow back up with PCP.  I did encourage patient to follow-up as scheduled blood pressure today 165/65.  Patient also is dehydrated as she drinks only 116 ounce bottle of water and a half a day.  I did provide education to patient with printed handouts given.  I completed CBC and B12 to rule out anemia.  For  headache encourage patient to use Tylenol.  She did report that symptoms are not new and that in the past she had considered filling out some FMLA paper until she gets better.  Advised patient to bring FMLA papers to clinic attention to cavity and forms given to PCP to complete. Treatment we will be provided after lab results. Problem List Items Addressed This Visit   None Visit Diagnoses     Fatigue, unspecified type    -  Primary   Relevant Orders   CBC with Differential   Vitamin B12       No orders of the defined types were placed in this encounter.   Return if symptoms worsen or fail to improve.  Daryll Drown, NP

## 2022-07-04 NOTE — Patient Instructions (Signed)
Fatigue If you have fatigue, you feel tired all the time and have a lack of energy or a lack of motivation. Fatigue may make it difficult to start or complete tasks because of exhaustion. Occasional or mild fatigue is often a normal response to activity or life. However, long-term (chronic) or extreme fatigue may be a symptom of a medical condition such as: Depression. Not having enough red blood cells or hemoglobin in the blood (anemia). A problem with a small gland located in the lower front part of the neck (thyroid disorder). Rheumatologic conditions. These are problems related to the body's defense system (immune system). Infections, especially certain viral infections. Fatigue can also lead to negative health outcomes over time. Follow these instructions at home: Medicines Take over-the-counter and prescription medicines only as told by your health care provider. Take a multivitamin if told by your health care provider. Do not use herbal or dietary supplements unless they are approved by your health care provider. Eating and drinking  Avoid heavy meals in the evening. Eat a well-balanced diet, which includes lean proteins, whole grains, plenty of fruits and vegetables, and low-fat dairy products. Avoid eating or drinking too many products with caffeine in them. Avoid alcohol. Drink enough fluid to keep your urine pale yellow. Activity  Exercise regularly, as told by your health care provider. Use or practice techniques to help you relax, such as yoga, tai chi, meditation, or massage therapy. Lifestyle Change situations that cause you stress. Try to keep your work and personal schedules in balance. Do not use recreational or illegal drugs. General instructions Monitor your fatigue for any changes. Go to bed and get up at the same time every day. Avoid fatigue by pacing yourself during the day and getting enough sleep at night. Maintain a healthy weight. Contact a health care  provider if: Your fatigue does not get better. You have a fever. You suddenly lose or gain weight. You have headaches. You have trouble falling asleep or sleeping through the night. You feel angry, guilty, anxious, or sad. You have swelling in your legs or another part of your body. Get help right away if: You feel confused, feel like you might faint, or faint. Your vision is blurry or you have a severe headache. You have severe pain in your abdomen, your back, or the area between your waist and hips (pelvis). You have chest pain, shortness of breath, or an irregular or fast heartbeat. You are unable to urinate, or you urinate less than normal. You have abnormal bleeding from the rectum, nose, lungs, nipples, or, if you are female, the vagina. You vomit blood. You have thoughts about hurting yourself or others. These symptoms may be an emergency. Get help right away. Call 911. Do not wait to see if the symptoms will go away. Do not drive yourself to the hospital. Get help right away if you feel like you may hurt yourself or others, or have thoughts about taking your own life. Go to your nearest emergency room or: Call 911. Call the [facility] at [phone] or 988. This is open 24 hours a day. Text the Crisis Text Line at 979-823-0385. Summary If you have fatigue, you feel tired all the time and have a lack of energy or a lack of motivation. Fatigue may make it difficult to start or complete tasks because of exhaustion. Long-term (chronic) or extreme fatigue may be a symptom of a medical condition. Exercise regularly, as told by your health care provider.  Change situations that cause you stress. Try to keep your work and personal schedules in balance. This information is not intended to replace advice given to you by your health care provider. Make sure you discuss any questions you have with your health care provider. Document Revised: 09/05/2021 Document  Reviewed: 09/05/2021 Elsevier Patient Education  2023 Else[employer] Inc.  

## 2022-07-05 ENCOUNTER — Other Ambulatory Visit: Payer: Self-pay | Admitting: Nurse Practitioner

## 2022-07-05 DIAGNOSIS — D508 Other iron deficiency anemias: Secondary | ICD-10-CM

## 2022-07-05 LAB — CBC WITH DIFFERENTIAL/PLATELET
Basophils Absolute: 0 10*3/uL (ref 0.0–0.2)
Basos: 1 %
EOS (ABSOLUTE): 0 10*3/uL (ref 0.0–0.4)
Eos: 1 %
Hematocrit: 25.9 % — ABNORMAL LOW (ref 34.0–46.6)
Hemoglobin: 9 g/dL — ABNORMAL LOW (ref 11.1–15.9)
Immature Grans (Abs): 0 10*3/uL (ref 0.0–0.1)
Immature Granulocytes: 0 %
Lymphocytes Absolute: 1.3 10*3/uL (ref 0.7–3.1)
Lymphs: 35 %
MCH: 32.7 pg (ref 26.6–33.0)
MCHC: 34.7 g/dL (ref 31.5–35.7)
MCV: 94 fL (ref 79–97)
Monocytes Absolute: 0.3 10*3/uL (ref 0.1–0.9)
Monocytes: 8 %
Neutrophils Absolute: 2.1 10*3/uL (ref 1.4–7.0)
Neutrophils: 55 %
Platelets: 283 10*3/uL (ref 150–450)
RBC: 2.75 x10E6/uL — ABNORMAL LOW (ref 3.77–5.28)
RDW: 12.9 % (ref 11.7–15.4)
WBC: 3.8 10*3/uL (ref 3.4–10.8)

## 2022-07-05 LAB — VITAMIN B12: Vitamin B-12: 526 pg/mL (ref 232–1245)

## 2022-07-05 MED ORDER — FERROUS SULFATE 325 (65 FE) MG PO TBEC: 325.0000 mg | DELAYED_RELEASE_TABLET | Freq: Every day | ORAL | 3 refills | Status: DC

## 2022-07-06 ENCOUNTER — Telehealth: Payer: Self-pay | Admitting: Family Medicine

## 2022-07-06 NOTE — Telephone Encounter (Signed)
Spoke with patient and she is now aware of lab results.  Will start Iron supplement and has an appointment scheduled in 5 days with Courtney Fitzgerald.

## 2022-07-06 NOTE — Telephone Encounter (Signed)
Spoke with Lynden Ang about disability ppw

## 2022-07-12 ENCOUNTER — Ambulatory Visit (INDEPENDENT_AMBULATORY_CARE_PROVIDER_SITE_OTHER): Payer: BC Managed Care – PPO | Admitting: Family Medicine

## 2022-07-12 ENCOUNTER — Encounter: Payer: Self-pay | Admitting: Family Medicine

## 2022-07-12 VITALS — Temp 98.4°F | Ht 66.0 in | Wt 164.2 lb

## 2022-07-12 DIAGNOSIS — D508 Other iron deficiency anemias: Secondary | ICD-10-CM | POA: Diagnosis not present

## 2022-07-12 DIAGNOSIS — I10 Essential (primary) hypertension: Secondary | ICD-10-CM

## 2022-07-12 DIAGNOSIS — R5383 Other fatigue: Secondary | ICD-10-CM

## 2022-07-12 DIAGNOSIS — E7800 Pure hypercholesterolemia, unspecified: Secondary | ICD-10-CM

## 2022-07-12 DIAGNOSIS — I951 Orthostatic hypotension: Secondary | ICD-10-CM | POA: Diagnosis not present

## 2022-07-12 DIAGNOSIS — R0602 Shortness of breath: Secondary | ICD-10-CM

## 2022-07-12 LAB — HEMOGLOBIN, FINGERSTICK: Hemoglobin: 12 g/dL (ref 11.1–15.9)

## 2022-07-12 NOTE — Patient Instructions (Signed)
Orthostatic Hypotension Blood pressure is a measurement of how strongly, or weakly, your circulating blood is pressing against the walls of your arteries. Orthostatic hypotension is a drop in blood pressure that can happen when you change positions, such as when you go from lying down to standing. Arteries are blood vessels that carry blood from your heart throughout your body. When blood pressure is too low, you may not get enough blood to your brain or to the rest of your organs. Orthostatic hypotension can cause light-headedness, sweating, rapid heartbeat, blurred vision, and fainting. These symptoms require further investigation into the cause. What are the causes? Orthostatic hypotension can be caused by many things, including: Sudden changes in posture, such as standing up quickly after you have been sitting or lying down. Loss of blood (anemia) or loss of body fluids (dehydration). Heart problems, neurologic problems, or hormone problems. Pregnancy. Aging. The risk for this condition increases as you get older. Severe infection (sepsis). Certain medicines, such as medicines for high blood pressure or medicines that make the body lose excess fluids (diuretics). What are the signs or symptoms? Symptoms of this condition may include: Weakness, light-headedness, or dizziness. Sweating. Blurred vision. Tiredness (fatigue). Rapid heartbeat. Fainting, in severe cases. How is this diagnosed? This condition is diagnosed based on: Your symptoms and medical history. Your blood pressure measurements. Your health care provider will check your blood pressure when you are: Lying down. Sitting. Standing. A blood pressure reading is recorded as two numbers, such as "120 over 80" (or 120/80). The first ("top") number is called the systolic pressure. It is a measure of the pressure in your arteries as your heart beats. The second ("bottom") number is called the diastolic pressure. It is a measure of  the pressure in your arteries when your heart relaxes between beats. Blood pressure is measured in a unit called mmHg. Healthy blood pressure for most adults is 120/80 mmHg. Orthostatic hypotension is defined as a 20 mmHg drop in systolic pressure or a 10 mmHg drop in diastolic pressure within 3 minutes of standing. Other information or tests that may be used to diagnose orthostatic hypotension include: Your other vital signs, such as your heart rate and temperature. Blood tests. An electrocardiogram (ECG) or echocardiogram. A Holter monitor. This is a device you wear that records your heart rhythm continuously, usually for 24-48 hours. Tilt table test. For this test, you will be safely secured to a table that moves you from a lying position to an upright position. Your heart rhythm and blood pressure will be monitored during the test. How is this treated? This condition may be treated by: Changing your diet. This may involve eating more salt (sodium) or drinking more water. Changing the dosage of certain medicines you are taking that might be lowering your blood pressure. Correcting the underlying reason for the orthostatic hypotension. Wearing compression stockings. Taking medicines to raise your blood pressure. Avoiding actions that trigger symptoms. Follow these instructions at home: Medicines Take over-the-counter and prescription medicines only as told by your health care provider. Follow instructions from your health care provider about changing the dosage of your current medicines, if this applies. Do not stop or adjust any of your medicines on your own. Eating and drinking  Drink enough fluid to keep your urine pale yellow. Eat extra salt only as directed. Do not add extra salt to your diet unless advised by your health care provider. Eat frequent, small meals. Avoid standing up suddenly after eating. General instructions    Get up slowly from lying down or sitting positions. This  gives your blood pressure a chance to adjust. Avoid hot showers and excessive heat as directed by your health care provider. Engage in regular physical activity as directed by your health care provider. If you have compression stockings, wear them as told. Keep all follow-up visits. This is important. Contact a health care provider if: You have a fever for more than 2-3 days. You feel more thirsty than usual. You feel dizzy or weak. Get help right away if: You have chest pain. You have a fast or irregular heartbeat. You become sweaty or feel light-headed. You feel short of breath. You faint. You have any symptoms of a stroke. "BE FAST" is an easy way to remember the main warning signs of a stroke: B - Balance. Signs are dizziness, sudden trouble walking, or loss of balance. E - Eyes. Signs are trouble seeing or a sudden change in vision. F - Face. Signs are sudden weakness or numbness of the face, or the face or eyelid drooping on one side. A - Arms. Signs are weakness or numbness in an arm. This happens suddenly and usually on one side of the body. S - Speech. Signs are sudden trouble speaking, slurred speech, or trouble understanding what people say. T - Time. Time to call emergency services. Write down what time symptoms started. You have other signs of a stroke, such as: A sudden, severe headache with no known cause. Nausea or vomiting. Seizure. These symptoms may represent a serious problem that is an emergency. Do not wait to see if the symptoms will go away. Get medical help right away. Call your local emergency services (911 in the [place] Do not drive yourself to the hospital. Summary Orthostatic hypotension is a sudden drop in blood pressure. It can cause light-headedness, sweating, rapid heartbeat, blurred vision, and fainting. Orthostatic hypotension can be diagnosed by having your blood pressure taken while lying down, sitting, and then standing. Treatment may involve  changing your diet, wearing compression stockings, sitting up slowly, adjusting your medicines, or correcting the underlying reason for the orthostatic hypotension. Get help right away if you have chest pain, a fast or irregular heartbeat, or symptoms of a stroke. This information is not intended to replace advice given to you by your health care provider. Make sure you discuss any questions you have with your health care provider. Document Revised: 01/27/2021 Document Reviewed: 01/27/2021 Elsevier Patient Education  2023 Elsevier Inc.  

## 2022-07-12 NOTE — Progress Notes (Signed)
Established Patient Office Visit  Subjective   Patient ID: Courtney Fitzgerald, female    DOB: 11/02/1961  Age: 61 y.o. MRN: [id number]  Chief Complaint  Patient presents with   Medical Management of Chronic Issues   Hypertension   Hyperlipidemia   HPI  Courtney Fitzgerald is here for a chronic follow up. She has been expericing dizziness, weakness, fatigue, and palpitations for the last 2 weeks. She was sent home from work as she has not been able to completed her job due to her symptoms. She was seen by another provider at the office and was found to be anemic and had FMLA completed by this provider. She reports that her symptoms have not improved. She continues to feel tired and weak. She also has dizziness, blurry vision, and a racing heart upon standing. She also feels short of breath and like her heart is racing when walking short distances. She does not have any symptoms beside general fatigue when at rest. She denies chest pain, edema, or orthopnea. She has started on a daily iron supplement. She drinks about 20 ounces of water a day as well as caffeine beverages. She typically eats a snack for breakfast, a light lunch, and a big dinner. She has remained out of work due to her symptoms. 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 She is unable to do this without significant symptoms that progress to presyncope. Symptoms improve with rest. She has been checking her BP at home but is not sure that her machine is working appropriately as the readings vary significantly. She did not a black stool recently however she had also been taking pepto at the time.       07/12/2022   10:25 AM 04/10/2022   10:25 AM 04/14/2021    8:10 AM  Depression screen PHQ 2/9  Decreased Interest 2 0 0  Down, Depressed, Hopeless 1 1 0  PHQ - 2 Score 3 1 0  Altered sleeping 2 2 1   Tired, decreased energy 3 1 1   Change in appetite 0 0 1  Feeling bad or failure about yourself  0 1 0  Trouble  concentrating 0 0 0  Moving slowly or fidgety/restless 0 0 0  Suicidal thoughts 0 0 0  PHQ-9 Score 8 5 3   Difficult doing work/chores Somewhat difficult Somewhat difficult Not difficult at all      07/12/2022   10:26 AM 04/10/2022   10:27 AM 04/14/2021    8:10 AM  GAD 7 : Generalized Anxiety Score  Nervous, Anxious, on Edge 0 0 0  Control/stop worrying 2 2 1   Worry too much - different things 2 2 1   Trouble relaxing 0 0 0  Restless 0 0 0  Easily annoyed or irritable 0 3 0  Afraid - awful might happen 0 0 0  Total GAD 7 Score 4 7 2   Anxiety Difficulty Not difficult at all Somewhat difficult Not difficult at all     Past Medical History:  Diagnosis Date   Allergy       Review of Systems  Constitutional:  Positive for malaise/fatigue. Negative for chills, diaphoresis, fever and weight loss.  HENT:  Negative for congestion and sore throat.   Eyes:  Positive for blurred vision. Negative for double vision, photophobia and pain.  Respiratory:  Positive for shortness of breath. Negative for cough.   Cardiovascular:  Positive for palpitations. Negative for chest pain, orthopnea, claudication, leg swelling and PND.  Gastrointestinal:  Negative for abdominal pain, nausea and vomiting.  Genitourinary:  Negative for dysuria.  Musculoskeletal:  Negative for myalgias and neck pain.  Skin:  Negative for rash.  Neurological:  Positive for dizziness and weakness. Negative for tremors, sensory change, speech change, focal weakness, seizures, loss of consciousness and headaches.  Psychiatric/Behavioral:  Negative for suicidal ideas.       Objective:     Temp 98.4 F (36.9 C) (Temporal)   Ht 5' 6"  (1.676 m)   Wt 164 lb 4 oz (74.5 kg)   BMI 26.51 kg/m  BP Readings from Last 3 Encounters:  07/04/22 (!) 155/68  04/10/22 (!) 155/79  04/14/21 114/68    Physical Exam Vitals and nursing note reviewed.  Constitutional:      General: She is not in acute distress.    Appearance: She is  not ill-appearing, toxic-appearing or diaphoretic.  HENT:     Head: Normocephalic and atraumatic.     Mouth/Throat:     Mouth: Mucous membranes are moist.     Pharynx: Oropharynx is clear.  Eyes:     Conjunctiva/sclera: Conjunctivae normal.     Pupils: Pupils are equal, round, and reactive to light.  Cardiovascular:     Rate and Rhythm: Normal rate and regular rhythm.     Heart sounds: Normal heart sounds. No murmur heard.    No friction rub. No gallop.  Pulmonary:     Effort: Pulmonary effort is normal. No respiratory distress.     Breath sounds: Normal breath sounds. No wheezing, rhonchi or rales.  Abdominal:     General: Bowel sounds are normal. There is no distension.     Palpations: Abdomen is soft.     Tenderness: There is no abdominal tenderness. There is no guarding or rebound.  Musculoskeletal:     Cervical back: Neck supple. No rigidity.     Right lower leg: No edema.     Left lower leg: No edema.  Skin:    General: Skin is warm and dry.  Neurological:     General: No focal deficit present.     Mental Status: She is alert and oriented to person, place, and time.     Motor: No weakness.     Gait: Gait normal.  Psychiatric:        Mood and Affect: Mood normal.        Behavior: Behavior normal.     No results found for any visits on 07/12/22.  Last CBC Lab Results  Component Value Date   WBC 3.8 07/04/2022   HGB 9.0 (L) 07/04/2022   HCT 25.9 (L) 07/04/2022   MCV 94 07/04/2022   MCH 32.7 07/04/2022   RDW 12.9 07/04/2022   PLT 283 23/55/7322   Last metabolic panel Lab Results  Component Value Date   GLUCOSE 108 (H) 04/10/2022   NA 133 (L) 04/10/2022   K 4.1 04/10/2022   CL 93 (L) 04/10/2022   CO2 23 04/10/2022   BUN 12 04/10/2022   CREATININE 0.99 04/10/2022   EGFR 66 04/10/2022   CALCIUM 9.7 04/10/2022   PROT 7.1 04/10/2022   ALBUMIN 4.5 04/10/2022   LABGLOB 2.6 04/10/2022   AGRATIO 1.7 04/10/2022   BILITOT 0.3 04/10/2022   ALKPHOS 104  04/10/2022   AST 19 04/10/2022   ALT 13 04/10/2022   ANIONGAP 12 02/27/2018   Last thyroid functions Lab Results  Component Value Date   TSH 1.050 02/25/2021   T4TOTAL 6.4 02/25/2021   Last  vitamin B12 and Folate Lab Results  Component Value Date   QKSKSHNG87 195 07/04/2022    The ASCVD Risk score (Arnett DK, et al., 2019) failed to calculate for the following reasons:   The valid HDL cholesterol range is 20 to 100 mg/dL    Assessment & Plan:   Courtney Fitzgerald was seen today for medical management of chronic issues, hypertension and hyperlipidemia.  Diagnoses and all orders for this visit:  Orthostatic hypotension Other iron deficiency anemia SOB (shortness of breath) on exertion Fatigue, unspecified type Primary hypertension + orthostatics today. Discussed this in relation to anemia. EKG with NSR today. Benign exam. Discussed compression socks, well balanced diet, increased water intake, and avoidance of caffeine and nicotine. Will check labs as below today. She is currently on FMLA due to severity of symptoms (palpitations, shortness of breath, dizziness, fatigue, weakness). Fingerstick hemoglobin is 12.0 today in office. Anemia panel is pending. Discussed possible referral to hematology pending results. FOBT sent home with patient to assess for possible GI blood loss. Did not adjust BP meds today as overall her BP is not at goal. Follow up in 1 week and she will bring her home monitor with her to assess it's accuracy.  -     EKG 12-Lead -     Anemia Profile B -     TSH -     Hemoglobin, fingerstick -     Fecal occult blood, imunochemical([facility])  Pure hypercholesterolemia On atorvastatin. Fasting lipid panel pending.  -     Lipid Panel  Return in about 1 week (around 07/19/2022) for follow up, bring BP monitor from home.   The patient indicates understanding of these issues and agrees with the plan.  Gwenlyn Perking, FNP

## 2022-07-13 ENCOUNTER — Other Ambulatory Visit: Payer: Self-pay

## 2022-07-13 ENCOUNTER — Encounter (HOSPITAL_COMMUNITY): Payer: Self-pay | Admitting: Emergency Medicine

## 2022-07-13 ENCOUNTER — Emergency Department (HOSPITAL_COMMUNITY)
Admission: EM | Admit: 2022-07-13 | Discharge: 2022-07-13 | Disposition: A | Discharge status: Home / Self Care | Payer: Self-pay | Attending: Emergency Medicine | Admitting: Emergency Medicine

## 2022-07-13 DIAGNOSIS — R531 Weakness: Secondary | ICD-10-CM | POA: Insufficient documentation

## 2022-07-13 DIAGNOSIS — E871 Hypo-osmolality and hyponatremia: Secondary | ICD-10-CM | POA: Insufficient documentation

## 2022-07-13 LAB — ANEMIA PROFILE B
Basophils Absolute: 0 10*3/uL (ref 0.0–0.2)
Basos: 1 %
EOS (ABSOLUTE): 0 10*3/uL (ref 0.0–0.4)
Eos: 1 %
Ferritin: 103 ng/mL (ref 15–150)
Folate: 7.5 ng/mL (ref >3.0)
Hematocrit: 29 % — ABNORMAL LOW (ref 34.0–46.6)
Hemoglobin: 10 g/dL — ABNORMAL LOW (ref 11.1–15.9)
Immature Grans (Abs): 0 10*3/uL (ref 0.0–0.1)
Immature Granulocytes: 0 %
Iron Saturation: 9 % — CL (ref 15–55)
Iron: 37 ug/dL (ref 27–159)
Lymphocytes Absolute: 1.3 10*3/uL (ref 0.7–3.1)
Lymphs: 29 %
MCH: 31.8 pg (ref 26.6–33.0)
MCHC: 34.5 g/dL (ref 31.5–35.7)
MCV: 92 fL (ref 79–97)
Monocytes Absolute: 0.3 10*3/uL (ref 0.1–0.9)
Monocytes: 7 %
Neutrophils Absolute: 2.9 10*3/uL (ref 1.4–7.0)
Neutrophils: 62 %
Platelets: 380 10*3/uL (ref 150–450)
RBC: 3.14 x10E6/uL — ABNORMAL LOW (ref 3.77–5.28)
RDW: 13.1 % (ref 11.7–15.4)
Retic Ct Pct: 2.9 % — ABNORMAL HIGH (ref 0.6–2.6)
Total Iron Binding Capacity: 422 ug/dL (ref 250–450)
UIBC: 385 ug/dL (ref 131–425)
Vitamin B-12: 491 pg/mL (ref 232–1245)
WBC: 4.7 10*3/uL (ref 3.4–10.8)

## 2022-07-13 LAB — BASIC METABOLIC PANEL
Anion gap: 10 (ref 5–15)
BUN: 15 mg/dL (ref 6–20)
CO2: 29 mmol/L (ref 22–32)
Calcium: 9.2 mg/dL (ref 8.9–10.3)
Chloride: 87 mmol/L — ABNORMAL LOW (ref 98–111)
Creatinine, Ser: 1.11 mg/dL — ABNORMAL HIGH (ref 0.44–1.00)
GFR, Estimated: 58 mL/min — ABNORMAL LOW (ref >60)
Glucose, Bld: 111 mg/dL — ABNORMAL HIGH (ref 70–99)
Potassium: 3.9 mmol/L (ref 3.5–5.1)
Sodium: 126 mmol/L — ABNORMAL LOW (ref 135–145)

## 2022-07-13 LAB — CBC
HCT: 30.1 % — ABNORMAL LOW (ref 36.0–46.0)
Hemoglobin: 10.3 g/dL — ABNORMAL LOW (ref 12.0–15.0)
MCH: 31.9 pg (ref 26.0–34.0)
MCHC: 34.2 g/dL (ref 30.0–36.0)
MCV: 93.2 fL (ref 80.0–100.0)
Platelets: 369 10*3/uL (ref 150–400)
RBC: 3.23 MIL/uL — ABNORMAL LOW (ref 3.87–5.11)
RDW: 13.5 % (ref 11.5–15.5)
WBC: 6.3 10*3/uL (ref 4.0–10.5)
nRBC: 0 % (ref 0.0–0.2)

## 2022-07-13 LAB — CMP14+EGFR
ALT: 20 IU/L (ref 0–32)
AST: 23 IU/L (ref 0–40)
Albumin/Globulin Ratio: 2.2 (ref 1.2–2.2)
Albumin: 4.7 g/dL (ref 3.8–4.9)
Alkaline Phosphatase: 112 IU/L (ref 44–121)
BUN/Creatinine Ratio: 11 (ref 9–23)
BUN: 11 mg/dL (ref 6–24)
Bilirubin Total: 0.4 mg/dL (ref 0.0–1.2)
CO2: 23 mmol/L (ref 20–29)
Calcium: 9.9 mg/dL (ref 8.7–10.2)
Chloride: 87 mmol/L — ABNORMAL LOW (ref 96–106)
Creatinine, Ser: 1 mg/dL (ref 0.57–1.00)
Globulin, Total: 2.1 g/dL (ref 1.5–4.5)
Glucose: 98 mg/dL (ref 70–99)
Potassium: 4.2 mmol/L (ref 3.5–5.2)
Sodium: 127 mmol/L — ABNORMAL LOW (ref 134–144)
Total Protein: 6.8 g/dL (ref 6.0–8.5)
eGFR: 65 mL/min/{1.73_m2} (ref >59)

## 2022-07-13 LAB — URINALYSIS, ROUTINE W REFLEX MICROSCOPIC
Bilirubin Urine: NEGATIVE
Glucose, UA: NEGATIVE mg/dL
Hgb urine dipstick: NEGATIVE
Ketones, ur: NEGATIVE mg/dL
Leukocytes,Ua: NEGATIVE
Nitrite: NEGATIVE
Protein, ur: NEGATIVE mg/dL
Specific Gravity, Urine: 1.005 (ref 1.005–1.030)
pH: 6 (ref 5.0–8.0)

## 2022-07-13 LAB — CBG MONITORING, ED: Glucose-Capillary: 109 mg/dL — ABNORMAL HIGH (ref 70–99)

## 2022-07-13 LAB — LIPID PANEL
Chol/HDL Ratio: 1.5 ratio (ref 0.0–4.4)
Cholesterol, Total: 219 mg/dL — ABNORMAL HIGH (ref 100–199)
HDL: 147 mg/dL (ref >39)
LDL Chol Calc (NIH): 61 mg/dL (ref 0–99)
Triglycerides: 63 mg/dL (ref 0–149)
VLDL Cholesterol Cal: 11 mg/dL (ref 5–40)

## 2022-07-13 LAB — TSH: TSH: 1.55 u[IU]/mL (ref 0.450–4.500)

## 2022-07-13 MED ORDER — LOSARTAN POTASSIUM 100 MG PO TABS: 100.0000 mg | ORAL_TABLET | Freq: Every day | ORAL | 0 refills | Status: DC

## 2022-07-13 MED ORDER — LOSARTAN POTASSIUM 25 MG PO TABS
100.0000 mg | ORAL_TABLET | Freq: Once | ORAL | Status: AC
  Administered 2022-07-13: 100 mg via ORAL
  Filled 2022-07-13: qty 4

## 2022-07-13 MED ORDER — SODIUM CHLORIDE 0.9 % IV BOLUS
1000.0000 mL | Freq: Once | INTRAVENOUS | Status: AC
Start: 2022-07-13 — End: 2022-07-13
  Administered 2022-07-13: 1000 mL via INTRAVENOUS

## 2022-07-13 NOTE — ED Triage Notes (Signed)
Pt presents with weakness x 2 weeks, had labs done at PCP, sodium critically low.

## 2022-07-13 NOTE — ED Provider Notes (Signed)
[facility] EMERGENCY DEPARTMENT Provider Note   CSN: [id number] Arrival date & time: 07/13/22  1213     History Chief Complaint  Patient presents with   Abnormal Labs    Laurie Penado is a 61 y.o. female patient presents to the emerged from today for further evaluation of abnormal labs.  Patient had a sodium level drawn and was notified today that was critically low and she needed to report to the nearest emergency room.  Over the last 2 to 3 weeks she has been generally weak and lethargic.  She originally saw her primary care doctor and had labs drawn and had a hemoglobin of 9.  This was thought to be contributing to her symptoms at that time.  She was placed on iron pills and had a repeat draw and hemoglobin is up to 10.  She had repeat labs once more which showed the sodium.  She is still having generalized weakness fatigue and feels like her legs are heavy.  She denies chest pain or shortness of breath.  HPI     Home Medications Prior to Admission medications   Medication Sig Start Date End Date Taking? Authorizing Provider  acetaminophen (TYLENOL) 500 MG tablet Take 500 mg by mouth every 6 (six) hours as needed.   Yes [provider]  atorvastatin (LIPITOR) 20 MG tablet Take 1 tablet (20 mg total) by mouth daily. 04/12/22  Yes Gabriel Earing, FNP  citalopram (CELEXA) 20 MG tablet Take 1 tablet (20 mg total) by mouth daily. 04/13/22  Yes Gabriel Earing, FNP  ferrous sulfate (FE TABS) 325 (65 FE) MG EC tablet Take 1 tablet (325 mg total) by mouth daily with breakfast. 07/05/22  Yes Daryll Drown, NP  ibuprofen (ADVIL) 600 MG tablet Take 600 mg by mouth every 6 (six) hours as needed.   Yes [provider]  losartan (COZAAR) 100 MG tablet Take 1 tablet (100 mg total) by mouth daily. 07/13/22  Yes Meredeth Ide, Keoni Havey M, PA-C  meloxicam (MOBIC) 15 MG tablet Take 1 tablet (15 mg total) by mouth daily. Patient not taking: Reported on 07/12/2022 04/10/22   Gabriel Earing,  FNP      Allergies    Prednisone    Review of Systems   Review of Systems  All other systems reviewed and are negative.   Physical Exam Updated Vital Signs BP (!) 162/77   Pulse 76   Temp 97.9 F (36.6 C) (Oral)   Resp 13   Ht 5\' 6"  (1.676 m)   Wt 74.4 kg   SpO2 100%   BMI 26.47 kg/m  Physical Exam Vitals and nursing note reviewed.  Constitutional:      General: She is not in acute distress.    Appearance: Normal appearance.  HENT:     Head: Normocephalic and atraumatic.  Eyes:     General:        Right eye: No discharge.        Left eye: No discharge.  Cardiovascular:     Comments: Regular rate and rhythm.  S1/S2 are distinct without any evidence of murmur, rubs, or gallops.  Radial pulses are 2+ bilaterally.  Dorsalis pedis pulses are 2+ bilaterally.  No evidence of pedal edema. Pulmonary:     Comments: Clear to auscultation bilaterally.  Normal effort.  No respiratory distress.  No evidence of wheezes, rales, or rhonchi heard throughout. Abdominal:     General: Abdomen is flat. Bowel sounds are normal. There is no  distension.     Tenderness: There is no abdominal tenderness. There is no guarding or rebound.  Musculoskeletal:        General: Normal range of motion.     Cervical back: Neck supple.  Skin:    General: Skin is warm and dry.     Findings: No rash.  Neurological:     General: No focal deficit present.     Mental Status: She is alert.  Psychiatric:        Mood and Affect: Mood normal.        Behavior: Behavior normal.     ED Results / Procedures / Treatments   Labs (all labs ordered are listed, but only abnormal results are displayed) Labs Reviewed  BASIC METABOLIC PANEL - Abnormal; Notable for the following components:      Result Value   Sodium 126 (*)    Chloride 87 (*)    Glucose, Bld 111 (*)    Creatinine, Ser 1.11 (*)    GFR, Estimated 58 (*)    All other components within normal limits  CBC - Abnormal; Notable for the following  components:   RBC 3.23 (*)    Hemoglobin 10.3 (*)    HCT 30.1 (*)    All other components within normal limits  CBG MONITORING, ED - Abnormal; Notable for the following components:   Glucose-Capillary 109 (*)    All other components within normal limits  URINALYSIS, ROUTINE W REFLEX MICROSCOPIC    EKG EKG Interpretation  Date/Time:  Thursday July 13 2022 13:51:14 EDT Ventricular Rate:  73 PR Interval:  157 QRS Duration: 83 QT Interval:  400 QTC Calculation: 441 R Axis:   77 Text Interpretation: Sinus rhythm Confirmed by Alvester Chou [id number]) on 07/13/2022 2:05:48 PM  Radiology No results found.  Procedures Procedures    Medications Ordered in ED Medications  losartan (COZAAR) tablet 100 mg (has no administration in time range)  sodium chloride 0.9 % bolus 1,000 mL (1,000 mLs Intravenous New Bag/Given 07/13/22 1536)    ED Course/ Medical Decision Making/ A&P Clinical Course as of 07/13/22 1654  Thu Jul 13, 2022  1550 On reevaluation, patient is feeling somewhat better.  I went over all labs with her at the bedside.  Liter of fluid is currently running.  She does take Hyzaar.  This could be contributing to her hyponatremia. [CF]  1646 On repeat evaluation, patient feeling slightly better and wanting to go home.  Liter of fluid has finished running. [CF]  1654 CBC(!) Mild anemia but improved from previous results. [CF]  1654 Basic metabolic panel(!) Evidence of hyponatremia and hypochloremia. [CF]  1654 CBG monitoring, ED(!) Slightly elevated. [CF]  1654 Urinalysis, Routine w reflex microscopic Urine, Clean Catch Normal. [CF]    Clinical Course User Index [CF] Teressa Lower, PA-C                           Medical Decision Making Shaida Route is a 61 y.o. female patient who presents to the emergency department today for further evaluation of generalized weakness and abnormal labs.  I will repeat labs to see what the sodium level is today in addition to  hemoglobin.  I will plan to reassess.  Apart from high blood pressure patient's vital signs are in good shape.  She does take her antihypertensive medications at night.  Patient does take Hyzaar which could be contributing to her hyponatremia.  Sodium level 126.  This is low but not critically low.  I will give her 1 L of fluid and have her stop Hyzaar and give her losartan 100.  I will have her follow-up with your primary care doctor for repeat labs early next week.  Patient feeling better after liter of fluid.  Plan remains the same.  I will have her follow-up with her primary care doctor.  Losartan given here in the emergency department.  I have sent a prescription for her pharmacy.  She is safe for discharge at this time.   Amount and/or Complexity of Data Reviewed Labs: ordered.  Risk Prescription drug management.   Final Clinical Impression(s) / ED Diagnoses Final diagnoses:  Generalized weakness  Hyponatremia    Rx / DC Orders ED Discharge Orders          Ordered    losartan (COZAAR) 100 MG tablet  Daily        07/13/22 1648              Honor Loh [place], [place] 07/13/22 1655    Terald Sleeper, MD 07/13/22 1702

## 2022-07-13 NOTE — Discharge Instructions (Addendum)
Follow-up with your primary care doctor early next week for repeat labs to ensure your hemoglobin and sodium is going in the right direction.  Please stop taking Hyzaar and start taking losartan.

## 2022-07-18 NOTE — Patient Instructions (Signed)
Our records indicate that you are due for your annual mammogram/breast imaging. While there is no way to prevent breast cancer, early detection provides the best opportunity for curing it. For women over the age of 40, the American Cancer Society recommends a yearly clinical breast exam and a yearly mammogram. These practices have saved thousands of lives. We need your help to ensure that you are receiving optimal medical care. Please call the imaging location that has done you previous mammograms. Please remember to list us as your primary care. This helps make sure we receive a report and can update your chart.  Below is the contact information for several local breast imaging centers. You may call the location that works best for you, and they will be happy to assistance in making you an appointment. You do not need an order for a regular screening mammogram. However, if you are having any problems or concerns with you breast area, please let your primary care provider know, and appropriate orders will be placed. Please let our office know if you have any questions or concerns. Or if you need information for another imaging center not on this list or outside of the area. We are commented to working with you on your health care journey.   The mobile unit/bus ([facility]) - they come twice a month to our location.  These appointments can be made through our office or by call The Breast Center  [facility]  [address] [place], [place] [address] Phone [phone]  [facility] Radiology Department  [address]  [place], [place] [address] [phone]  [facility])  [address] [place], [place] [address] [phone]  [facility] [place]  [address] [place] [address] [phone]  [facility] [place]  [address], [address] [place] [address] [phone]  [facility] [place]  [address] [place], [place] [address] [phone]  [facility] [address] [place], [place] [address] [phone]  [facility] [address]  [address] [place], [place] [address] [phone]  [facility] [address] [place] [address] [phone]     

## 2022-07-19 ENCOUNTER — Encounter: Payer: Self-pay | Admitting: Family Medicine

## 2022-07-19 ENCOUNTER — Ambulatory Visit (INDEPENDENT_AMBULATORY_CARE_PROVIDER_SITE_OTHER): Payer: BC Managed Care – PPO | Admitting: Family Medicine

## 2022-07-19 VITALS — BP 144/79 | HR 83 | Temp 98.2°F | Ht 66.0 in | Wt 167.2 lb

## 2022-07-19 DIAGNOSIS — R0602 Shortness of breath: Secondary | ICD-10-CM

## 2022-07-19 DIAGNOSIS — I10 Essential (primary) hypertension: Secondary | ICD-10-CM

## 2022-07-19 DIAGNOSIS — E871 Hypo-osmolality and hyponatremia: Secondary | ICD-10-CM

## 2022-07-19 DIAGNOSIS — I951 Orthostatic hypotension: Secondary | ICD-10-CM | POA: Diagnosis not present

## 2022-07-19 DIAGNOSIS — D508 Other iron deficiency anemias: Secondary | ICD-10-CM | POA: Diagnosis not present

## 2022-07-19 NOTE — Progress Notes (Unsigned)
Established Patient Office Visit  Subjective   Patient ID: Courtney Fitzgerald, female    DOB: 1961/04/23  Age: 61 y.o. MRN: [id number]  Chief Complaint  Patient presents with   ER follow up    HPI Courtney Fitzgerald was seen in the ER at [facility] on 07/13/22 for hyponatremia. She was given fluids to replete this. She was also switched from Hyzaar to plain losartan.  She reports some improvement in symptoms overall. She does not get dizzy each times she stands, although this does still occur at times throughout the day. She does still feels dizzy and weak with daily activities however. Simple activities such as pulling her empty trash can from the road to her house leaves her feeling exhausted and short of breath. She has been working to increase her water intake. She has been drinking 48 ounces of water a day. She has been avoiding caffeine. She has not gotten any compression socks yet. Reports that diet has improved. She does eat a fair amount of salt on her food. She has been checking her BP at home and reports that it has been very elevated.    Past Medical History:  Diagnosis Date   Allergy       Review of Systems  Constitutional:  Positive for malaise/fatigue. Negative for chills, diaphoresis, fever and weight loss.  Respiratory:  Positive for shortness of breath. Negative for wheezing.   Cardiovascular:  Negative for chest pain, palpitations and leg swelling.  Gastrointestinal:  Negative for abdominal pain, nausea and vomiting.  Genitourinary:  Negative for dysuria.  Neurological:  Positive for dizziness and weakness. Negative for tingling, tremors, focal weakness, seizures and loss of consciousness.      Objective:     BP (!) 144/79 Comment: left arm  Pulse 83   Temp 98.2 F (36.8 C) (Temporal)   Ht 5' 6"  (1.676 m)   Wt 167 lb 4 oz (75.9 kg)   BMI 26.99 kg/m  Wt Readings from Last 3 Encounters:  07/19/22 167 lb 4 oz (75.9 kg)  07/13/22 164 lb (74.4 kg)  07/12/22 164 lb 4 oz (74.5 kg)       Physical Exam Vitals and nursing note reviewed.  Constitutional:      General: She is not in acute distress.    Appearance: She is not ill-appearing, toxic-appearing or diaphoretic.  HENT:     Nose: Nose normal.     Mouth/Throat:     Mouth: Mucous membranes are moist.     Pharynx: Oropharynx is clear.  Eyes:     General:        Right eye: No discharge.        Left eye: No discharge.     Extraocular Movements: Extraocular movements intact.     Pupils: Pupils are equal, round, and reactive to light.  Cardiovascular:     Rate and Rhythm: Normal rate and regular rhythm.     Heart sounds: Normal heart sounds. No murmur heard. Pulmonary:     Effort: Pulmonary effort is normal.     Breath sounds: Normal breath sounds.  Abdominal:     General: Bowel sounds are normal. There is no distension.     Palpations: Abdomen is soft.     Tenderness: There is no abdominal tenderness. There is no guarding or rebound.  Musculoskeletal:        General: No swelling.     Right lower leg: No edema.     Left lower leg: No edema.  Skin:  General: Skin is warm and dry.  Neurological:     General: No focal deficit present.     Mental Status: She is alert.     Cranial Nerves: No cranial nerve deficit.     Sensory: No sensory deficit.     Motor: No weakness (generalized).     Gait: Gait normal.     Deep Tendon Reflexes: Reflexes normal.  Psychiatric:        Mood and Affect: Mood normal.        Behavior: Behavior normal.      No results found for any visits on 07/19/22.  Last CBC Lab Results  Component Value Date   WBC 5.9 07/19/2022   HGB 10.3 (L) 07/19/2022   HCT 30.8 (L) 07/19/2022   MCV 95 07/19/2022   MCH 31.6 07/19/2022   RDW 13.2 07/19/2022   PLT 288 97/35/3299   Last metabolic panel Lab Results  Component Value Date   GLUCOSE 110 (H) 07/19/2022   NA 131 (L) 07/19/2022   K 5.0 07/19/2022   CL 96 07/19/2022   CO2 22 07/19/2022   BUN 9 07/19/2022   CREATININE 1.02 (H)  07/19/2022   EGFR 64 07/19/2022   CALCIUM 9.3 07/19/2022   PROT 6.8 07/12/2022   ALBUMIN 4.7 07/12/2022   LABGLOB 2.1 07/12/2022   AGRATIO 2.2 07/12/2022   BILITOT 0.4 07/12/2022   ALKPHOS 112 07/12/2022   AST 23 07/12/2022   ALT 20 07/12/2022   ANIONGAP 10 07/13/2022      The ASCVD Risk score (Arnett DK, et al., 2019) failed to calculate for the following reasons:   The valid HDL cholesterol range is 20 to 100 mg/dL    Assessment & Plan:   Courtney Fitzgerald was seen today for er follow up.  Diagnoses and all orders for this visit:  Orthostatic hypotension -     Iron, TIBC and Ferritin Panel -     CBC with Differential/Platelet -     BMP8+EGFR -     Hemoglobin, fingerstick  Other iron deficiency anemia -     Iron, TIBC and Ferritin Panel -     CBC with Differential/Platelet -     Hemoglobin, fingerstick  Hyponatremia -     BMP8+EGFR  Primary hypertension SOB  Some improvement in symptoms with iron supplement, IV fluid repletion, hydration, and improved diet. Diuretic has been discontinued. BP is fairly well controlled today in officer. Her monitor from home was very inaccurate. Discussed to purchase a new monitor and monitor BP at home. Bring log to next visit. She is still symptomatic enough that she would not be able to safely complete her job requirement. Will check labs as above. Discussed compression socks as well. Will notify patient of plan pending labs. Follow up in 1 week to reassess, sooner for new or worsening symptoms.   Return in about 1 week (around 07/26/2022).   The patient indicates understanding of these issues and agrees with the plan.  Gwenlyn Perking, FNP

## 2022-07-20 ENCOUNTER — Other Ambulatory Visit: Payer: BC Managed Care – PPO

## 2022-07-20 ENCOUNTER — Encounter: Payer: Self-pay | Admitting: Family Medicine

## 2022-07-20 ENCOUNTER — Other Ambulatory Visit: Payer: Self-pay | Admitting: Family Medicine

## 2022-07-20 DIAGNOSIS — F330 Major depressive disorder, recurrent, mild: Secondary | ICD-10-CM

## 2022-07-20 DIAGNOSIS — E871 Hypo-osmolality and hyponatremia: Secondary | ICD-10-CM | POA: Diagnosis not present

## 2022-07-20 DIAGNOSIS — F419 Anxiety disorder, unspecified: Secondary | ICD-10-CM

## 2022-07-20 LAB — CBC WITH DIFFERENTIAL/PLATELET
Basophils Absolute: 0 10*3/uL (ref 0.0–0.2)
Basos: 1 %
EOS (ABSOLUTE): 0.1 10*3/uL (ref 0.0–0.4)
Eos: 1 %
Hematocrit: 30.8 % — ABNORMAL LOW (ref 34.0–46.6)
Hemoglobin: 10.3 g/dL — ABNORMAL LOW (ref 11.1–15.9)
Immature Grans (Abs): 0 10*3/uL (ref 0.0–0.1)
Immature Granulocytes: 0 %
Lymphocytes Absolute: 1.5 10*3/uL (ref 0.7–3.1)
Lymphs: 25 %
MCH: 31.6 pg (ref 26.6–33.0)
MCHC: 33.4 g/dL (ref 31.5–35.7)
MCV: 95 fL (ref 79–97)
Monocytes Absolute: 0.4 10*3/uL (ref 0.1–0.9)
Monocytes: 6 %
Neutrophils Absolute: 4 10*3/uL (ref 1.4–7.0)
Neutrophils: 67 %
Platelets: 288 10*3/uL (ref 150–450)
RBC: 3.26 x10E6/uL — ABNORMAL LOW (ref 3.77–5.28)
RDW: 13.2 % (ref 11.7–15.4)
WBC: 5.9 10*3/uL (ref 3.4–10.8)

## 2022-07-20 LAB — BMP8+EGFR
BUN/Creatinine Ratio: 9 (ref 9–23)
BUN: 9 mg/dL (ref 6–24)
CO2: 22 mmol/L (ref 20–29)
Calcium: 9.3 mg/dL (ref 8.7–10.2)
Chloride: 96 mmol/L (ref 96–106)
Creatinine, Ser: 1.02 mg/dL — ABNORMAL HIGH (ref 0.57–1.00)
Glucose: 110 mg/dL — ABNORMAL HIGH (ref 70–99)
Potassium: 5 mmol/L (ref 3.5–5.2)
Sodium: 131 mmol/L — ABNORMAL LOW (ref 134–144)
eGFR: 64 mL/min/{1.73_m2} (ref >59)

## 2022-07-20 LAB — IRON,TIBC AND FERRITIN PANEL
Ferritin: 137 ng/mL (ref 15–150)
Iron Saturation: 17 % (ref 15–55)
Iron: 65 ug/dL (ref 27–159)
Total Iron Binding Capacity: 390 ug/dL (ref 250–450)
UIBC: 325 ug/dL (ref 131–425)

## 2022-07-20 MED ORDER — DULOXETINE HCL 30 MG PO CPEP: 30.0000 mg | ORAL_CAPSULE | Freq: Every day | ORAL | 3 refills | Status: DC

## 2022-07-21 ENCOUNTER — Telehealth: Payer: Self-pay | Admitting: Family Medicine

## 2022-07-21 LAB — SODIUM, URINE, RANDOM: Sodium, Ur: 28 mmol/L

## 2022-07-21 NOTE — Telephone Encounter (Signed)
I have talked with pt and advised her Courtney Fitzgerald will be in office on Monday and I will route to her for Monday.

## 2022-07-21 NOTE — Telephone Encounter (Signed)
Patient calling about FMLA paperwork. She dropped off paperwork when she was at her appointment on 8/24 but she is being told by her insurance that we told them we are unable to complete any paperwork because she has not authorized. Please call back.

## 2022-07-24 ENCOUNTER — Other Ambulatory Visit: Payer: BC Managed Care – PPO

## 2022-07-24 ENCOUNTER — Other Ambulatory Visit: Payer: Self-pay | Admitting: Family Medicine

## 2022-07-24 DIAGNOSIS — E871 Hypo-osmolality and hyponatremia: Secondary | ICD-10-CM

## 2022-07-25 LAB — BMP8+EGFR
BUN/Creatinine Ratio: 8 — ABNORMAL LOW (ref 9–23)
BUN: 7 mg/dL (ref 6–24)
CO2: 20 mmol/L (ref 20–29)
Calcium: 9.1 mg/dL (ref 8.7–10.2)
Chloride: 95 mmol/L — ABNORMAL LOW (ref 96–106)
Creatinine, Ser: 0.91 mg/dL (ref 0.57–1.00)
Glucose: 92 mg/dL (ref 70–99)
Potassium: 4.7 mmol/L (ref 3.5–5.2)
Sodium: 136 mmol/L (ref 134–144)
eGFR: 73 mL/min/{1.73_m2} (ref >59)

## 2022-07-25 LAB — SODIUM, URINE, RANDOM: Sodium, Ur: 29 mmol/L

## 2022-07-25 NOTE — Telephone Encounter (Signed)
Talked w/ pt yesterday, aware received ppw back from PCP. I looked over ppw and faxed them today.

## 2022-07-27 ENCOUNTER — Inpatient Hospital Stay (INDEPENDENT_AMBULATORY_CARE_PROVIDER_SITE_OTHER): Payer: BC Managed Care – PPO

## 2022-07-27 ENCOUNTER — Ambulatory Visit (INDEPENDENT_AMBULATORY_CARE_PROVIDER_SITE_OTHER): Payer: BC Managed Care – PPO | Admitting: Family Medicine

## 2022-07-27 ENCOUNTER — Encounter: Payer: Self-pay | Admitting: Family Medicine

## 2022-07-27 VITALS — BP 107/61 | HR 99 | Temp 98.6°F | Ht 66.0 in | Wt 168.5 lb

## 2022-07-27 DIAGNOSIS — I951 Orthostatic hypotension: Secondary | ICD-10-CM | POA: Diagnosis not present

## 2022-07-27 DIAGNOSIS — R42 Dizziness and giddiness: Secondary | ICD-10-CM

## 2022-07-27 DIAGNOSIS — D509 Iron deficiency anemia, unspecified: Secondary | ICD-10-CM

## 2022-07-27 DIAGNOSIS — R000 Tachycardia, unspecified: Secondary | ICD-10-CM | POA: Diagnosis not present

## 2022-07-27 DIAGNOSIS — I10 Essential (primary) hypertension: Secondary | ICD-10-CM

## 2022-07-27 DIAGNOSIS — E871 Hypo-osmolality and hyponatremia: Secondary | ICD-10-CM | POA: Diagnosis not present

## 2022-07-27 MED ORDER — LOSARTAN POTASSIUM 50 MG PO TABS: 50.0000 mg | ORAL_TABLET | Freq: Every day | ORAL | 1 refills | Status: DC

## 2022-07-27 NOTE — Patient Instructions (Signed)
Hypotension As the heart beats, it forces blood through the body. Hypotension, commonly called low blood pressure, is when the force of blood pumping through the arteries is too weak. Arteries are blood vessels that carry blood from the heart throughout the body. Depending on the cause and severity, hypotension may be harmless (benign) or may cause serious problems (be critical). When your blood pressure is too low, you may not get enough blood to your brain or to the rest of your organs. This can cause weakness, light-headedness, a rapid heartbeat, and fainting. What are the causes? This condition may be caused by: Blood loss. Loss of body fluids (dehydration). Heart problems. Hormone (endocrine) problems. Pregnancy. Severe infection. Lack of certain nutrients. Severe allergic reactions (anaphylaxis). Certain medicines, such as blood pressure medicine or medicines that make the body lose excess fluids (diuretics). Sometimes, hypotension may be caused by not taking medicine as directed, such as taking too much of a certain medicine. What increases the risk? The following factors may make you more likely to develop this condition: Age. Risk increases as you get older. Having a condition that affects the heart or the central nervous system. What are the signs or symptoms? Common symptoms of this condition include: Weakness. Light-headedness. Dizziness. Blurred vision. Tiredness (fatigue). Rapid heartbeat. Fainting, in severe cases. How is this diagnosed? This condition is diagnosed based on: Your medical history. Your symptoms. Your blood pressure measurement. Your health care provider will check your blood pressure when you are: Lying down. Sitting. Standing. A blood pressure reading is recorded as two numbers, such as "120 over 80" (or 120/80). The first ("top") number is called the systolic pressure. It is a measure of the pressure in your arteries as your heart beats. The second  ("bottom") number is called the diastolic pressure. It is a measure of the pressure in your arteries when your heart relaxes between beats. Blood pressure is measured in a unit called mm Hg. Healthy blood pressure for most adults is 120/80. If your blood pressure is below 90/60, you may be diagnosed with hypotension. Other information or tests that may be used to diagnose hypotension include: Your other vital signs, such as your heart rate and temperature. Blood tests. Tilt table test. For this test, you will be safely secured to a table that moves you from a lying position to an upright position. Your heart rhythm and blood pressure will be monitored during the test. How is this treated? Treatment for this condition may include: Changing your diet. This may involve drinking more water or increasing your salt (sodium) intake with high-sodium foods. Taking medicines to raise your blood pressure. Changing the dosage of certain medicines you are taking that might be lowering your blood pressure. Wearing compression stockings. These stockings help to prevent blood clots and reduce swelling in your legs. In some cases, you may need to go to the hospital for: Fluid replacement. This means you will receive fluids through an IV. Blood replacement. This means you will receive donated blood through an IV (transfusion). Treating an infection or heart problems, if this applies. Monitoring. You may need to be monitored while medicines that you are taking wear off. Follow these instructions at home: Eating and drinking  Drink enough fluid to keep your urine pale yellow. Eat a healthy diet, and follow instructions from your health care provider about eating or drinking restrictions. A healthy diet includes: Fresh fruits and vegetables. Whole grains. Lean meats. Low-fat dairy products. Increase your salt intake if told   to do so. Do not add extra salt to your diet unless your health care provider tells you  to do that. Eat frequent, small meals. Avoid standing up suddenly after eating. Medicines Take over-the-counter and prescription medicines only as told by your health care provider. Follow instructions from your health care provider about changing the dosage of your current medicines, if this applies. Do not stop or adjust any of your medicines on your own. General instructions  Wear compression stockings as told by your health care provider. Get up slowly from lying down or sitting positions. This gives your blood pressure a chance to adjust. Avoid hot showers and excessive heat as directed by your health care provider. Return to your normal activities as told by your health care provider. Ask your health care provider what activities are safe for you. Do not use any products that contain nicotine or tobacco. These products include cigarettes, chewing tobacco, and vaping devices, such as e-cigarettes. If you need help quitting, ask your health care provider. Keep all follow-up visits. This is important. Contact a health care provider if: You vomit. You have diarrhea. You have a fever for more than 2-3 days. You feel more thirsty than usual. You feel weak and tired. Get help right away if: You have chest pain. You have a fast or irregular heartbeat. You develop numbness in any part of your body. You cannot move your arms or your legs. You have trouble speaking. You become sweaty or feel light-headed. You faint. You feel short of breath. You have trouble staying awake. You feel confused. These symptoms may be an emergency. Get help right away. Call 911. Do not wait to see if the symptoms will go away. Do not drive yourself to the hospital. Summary Hypotension is when the force of blood pumping through the arteries is too weak. Hypotension may be harmless (benign) or may cause serious problems (be critical). Treatment for this condition may include changing your diet, changing  your medicines, and wearing compression stockings. In some cases, you may need to go to the hospital for fluid or blood replacement. This information is not intended to replace advice given to you by your health care provider. Make sure you discuss any questions you have with your health care provider. Document Revised: 07/04/2021 Document Reviewed: 07/04/2021 Elsevier Patient Education  2023 [employer]  

## 2022-07-27 NOTE — Progress Notes (Signed)
Established Patient Office Visit  Subjective   Patient ID: Courtney Fitzgerald, female    DOB: 03/18/1961  Age: 61 y.o. MRN: [id number]  Chief Complaint  Patient presents with   orthostatic hypotension    HPI Courtney Fitzgerald is here for follow up orthostatic hypotension, anemia, and hyponatremia. She has been drinking gatorade, tomato juice, and eating a high salt diet. She has also been switched from Celexa to Cymbalta.   She reports that overall she feels better and is able to tolerate more light activity than before. She reports that her symptoms still come in episodes daily with lightheadedness, weakness, and fatigue. Symptoms are less in severity and frequency now. However when she was grocery shopping yesterday she felt very lightheaded and weak. She had to stop and rest due to her symptoms and was unable to complete the shopping. She felt like her heart was racing during this episode. This lasted for about 15-20 minutes and resolved with rest. She is still not at a point that she can tolerate the fast paced work load that requires standing for long periods at her job.  She has been monitoring her BP at home. She has been checking it at the same time each day, regardless of activity. Her BP has been ranging from 552-080E systolic. She did purchase a few cuff. However this cuff is also significantly off from our BP today in the office.   She continues to avoid caffeine, eat regularly, and focus of fluid intake.     07/27/2022   11:22 AM 07/19/2022   11:24 AM 07/12/2022   10:25 AM  Depression screen PHQ 2/9  Decreased Interest 1 1 2   Down, Depressed, Hopeless 0 0 1  PHQ - 2 Score 1 1 3   Altered sleeping 1 1 2   Tired, decreased energy 1 3 3   Change in appetite 0 0 0  Feeling bad or failure about yourself  0 0 0  Trouble concentrating 0 0 0  Moving slowly or fidgety/restless 1 0 0  Suicidal thoughts 0 0 0  PHQ-9 Score 4 5 8   Difficult doing work/chores Very difficult Very difficult Somewhat  difficult      07/27/2022   11:24 AM 07/19/2022   11:24 AM 07/12/2022   10:26 AM 04/10/2022   10:27 AM  GAD 7 : Generalized Anxiety Score  Nervous, Anxious, on Edge 0 1 0 0  Control/stop worrying 1 2 2 2   Worry too much - different things 1 2 2 2   Trouble relaxing 0 1 0 0  Restless 0 0 0 0  Easily annoyed or irritable 0 1 0 3  Afraid - awful might happen 0 0 0 0  Total GAD 7 Score 2 7 4 7   Anxiety Difficulty Somewhat difficult Somewhat difficult Not difficult at all Somewhat difficult      Past Medical History:  Diagnosis Date   Allergy       Review of Systems  Constitutional:  Positive for malaise/fatigue. Negative for chills, diaphoresis and fever.  Eyes:  Negative for double vision.  Respiratory:  Negative for shortness of breath and wheezing.   Cardiovascular:  Negative for chest pain and leg swelling.  Gastrointestinal:  Negative for diarrhea and vomiting.  Neurological:  Positive for dizziness and weakness. Negative for tingling, tremors, sensory change, speech change, focal weakness, seizures and loss of consciousness.     Objective:     BP 107/61   Pulse 99   Temp 98.6 F (37 C) (Temporal)  Ht 5' 6"  (1.676 m)   Wt 168 lb 8 oz (76.4 kg)   SpO2 99%   BMI 27.20 kg/m  BP Readings from Last 3 Encounters:  07/27/22 107/61  07/19/22 (!) 144/79  07/13/22 (!) 162/77      Physical Exam Vitals and nursing note reviewed.  Constitutional:      General: She is not in acute distress.    Appearance: She is not ill-appearing, toxic-appearing or diaphoretic.  HENT:     Nose: Nose normal.     Mouth/Throat:     Mouth: Mucous membranes are moist.     Pharynx: Oropharynx is clear.  Eyes:     Extraocular Movements: Extraocular movements intact.     Pupils: Pupils are equal, round, and reactive to light.  Cardiovascular:     Rate and Rhythm: Normal rate and regular rhythm.     Heart sounds: Normal heart sounds. No murmur heard. Pulmonary:     Effort: Pulmonary  effort is normal. No respiratory distress.     Breath sounds: Normal breath sounds.  Abdominal:     General: Bowel sounds are normal.     Palpations: Abdomen is soft.  Musculoskeletal:     Right lower leg: No edema.     Left lower leg: No edema.  Skin:    General: Skin is warm and dry.  Neurological:     General: No focal deficit present.     Mental Status: She is alert and oriented to person, place, and time.  Psychiatric:        Mood and Affect: Mood normal.        Behavior: Behavior normal.      No results found for any visits on 07/27/22.  Last CBC Lab Results  Component Value Date   WBC 5.9 07/19/2022   HGB 10.3 (L) 07/19/2022   HCT 30.8 (L) 07/19/2022   MCV 95 07/19/2022   MCH 31.6 07/19/2022   RDW 13.2 07/19/2022   PLT 288 08/67/6195   Last metabolic panel Lab Results  Component Value Date   GLUCOSE 92 07/24/2022   NA 136 07/24/2022   K 4.7 07/24/2022   CL 95 (L) 07/24/2022   CO2 20 07/24/2022   BUN 7 07/24/2022   CREATININE 0.91 07/24/2022   EGFR 73 07/24/2022   CALCIUM 9.1 07/24/2022   PROT 6.8 07/12/2022   ALBUMIN 4.7 07/12/2022   LABGLOB 2.1 07/12/2022   AGRATIO 2.2 07/12/2022   BILITOT 0.4 07/12/2022   ALKPHOS 112 07/12/2022   AST 23 07/12/2022   ALT 20 07/12/2022   ANIONGAP 10 07/13/2022      The ASCVD Risk score (Arnett DK, et al., 2019) failed to calculate for the following reasons:   The valid HDL cholesterol range is 20 to 100 mg/dL    Assessment & Plan:   Courtney Fitzgerald was seen today for orthostatic hypotension.  Diagnoses and all orders for this visit:  Orthostatic hypotension Still with positive orthostatics. Improving symptoms. BP is well controlled in office today. Will decrease losartan dosage to see if this helps with symptoms. Continue high salt diet, hydration, avoidance of caffeine, slow movement. Discussed to do activity as tolerated to avoid physical deconditioning. She is still not able to return to work at this time as she is  not able to handle standing and the fast pace work without significant symptoms.  Hyponatremia Normal sodium on labs from 07/24/22. Continue high salt diet, fluids with sodium. Will recheck at next visit.   Iron deficiency  anemia, unspecified iron deficiency anemia type Stable. Continue iron supplement.   Primary hypertension Will controlled BP today in office. Will decrease dosage to see if this helps with symptoms without to much elevation in BP. Discussed proper home monitoring of BP.  -     losartan (COZAAR) 50 MG tablet; Take 1 tablet (50 mg total) by mouth daily.  Tachycardia Episodic lightheadedness Zio placed today to assess for potential arrhthymias, although this could be due to her orthostatic hypotension.  -     LONG TERM MONITOR (3-14 DAYS); Future  Return in about 1 week (around 08/03/2022) for follow up and labs.  The patient indicates understanding of these issues and agrees with the plan.   Gwenlyn Perking, FNP

## 2022-08-01 NOTE — Telephone Encounter (Signed)
Patient received a call from her HR saying that she was released to return to work. She stated that she has not been released by [name] to return and they are needing a letter stating that she has not been released. Please call patient back when letter is ready.

## 2022-08-01 NOTE — Telephone Encounter (Signed)
Left message informing pt that we did received paperwork /from [employer]. They are requesting OV notes from visit and completion of form.  Notes printed and form completed. Placed in [name]'s office for review. The form does not have a RTW date. It states TBD ( to be determined). Informed pt of this and that she will need to be evaluated on 9/7 by [name] to see when she can return to work. Advised pt to call back with any concerns.

## 2022-08-03 ENCOUNTER — Encounter: Payer: Self-pay | Admitting: Family Medicine

## 2022-08-03 ENCOUNTER — Ambulatory Visit (INDEPENDENT_AMBULATORY_CARE_PROVIDER_SITE_OTHER): Payer: BC Managed Care – PPO | Admitting: Family Medicine

## 2022-08-03 VITALS — BP 122/72 | HR 90 | Temp 98.4°F | Ht 66.0 in | Wt 171.6 lb

## 2022-08-03 DIAGNOSIS — R000 Tachycardia, unspecified: Secondary | ICD-10-CM | POA: Diagnosis not present

## 2022-08-03 DIAGNOSIS — I951 Orthostatic hypotension: Secondary | ICD-10-CM

## 2022-08-03 DIAGNOSIS — E871 Hypo-osmolality and hyponatremia: Secondary | ICD-10-CM

## 2022-08-03 DIAGNOSIS — R42 Dizziness and giddiness: Secondary | ICD-10-CM

## 2022-08-03 DIAGNOSIS — I10 Essential (primary) hypertension: Secondary | ICD-10-CM

## 2022-08-03 DIAGNOSIS — D509 Iron deficiency anemia, unspecified: Secondary | ICD-10-CM | POA: Diagnosis not present

## 2022-08-03 NOTE — Progress Notes (Signed)
Established Patient Office Visit  Subjective   Patient ID: Courtney Fitzgerald, female    DOB: 21-Jul-1961  Age: 61 y.o. MRN: [id number]  Chief Complaint  Patient presents with   Medical Management of Chronic Issues    HPI Courtney Fitzgerald is here for follow up orthostatic hypotension, anemia, and hyponatremia. She continues to push fluid intake and has been drinking gatorade daily and eating a high salt diet. Her losartan was decreased from 100 to 50 mg daily last week. She has been monitoring her BP at home and most readings have been 130s/80s or less. She has been wearing the zio monitor since her last visit. She reports that she does feel much better this week. She is not back to her baseline as she still has periods of fatigued throughout the day. She has been doing more activity this week. She has only had 2 episodes of dizziness/lightheadedness this week. Once with sweeping and once with vacuuming. She also was able to complete some grocery shopping this week without stopping although it did leave her quite fatigued after completing this. She feels like she would be able to tolerate work after another week to increase her endurance at home. She currently would not be able to tolerate the long periods of standing that are required for her job. She is able to drink water while working but is only able to drink water. She gets a 15 minute break about 2.5 hours into her day and then a 30 minute lunch about 3 hours day. She works an 8 hour day.      ROS Negative unless specially indicated above in HPI.   Objective:     BP 122/72   Pulse 90   Temp 98.4 F (36.9 C)   Ht 5' 6" (1.676 m)   Wt 171 lb 9.6 oz (77.8 kg)   SpO2 99%   BMI 27.70 kg/m  BP Readings from Last 3 Encounters:  08/03/22 122/72  07/27/22 107/61  07/19/22 (!) 144/79      Physical Exam Vitals and nursing note reviewed.  Constitutional:      General: She is not in acute distress.    Appearance: She is not ill-appearing,  toxic-appearing or diaphoretic.  HENT:     Mouth/Throat:     Mouth: Mucous membranes are moist.     Pharynx: Oropharynx is clear.  Eyes:     General:        Right eye: No discharge.        Left eye: No discharge.     Extraocular Movements: Extraocular movements intact.     Pupils: Pupils are equal, round, and reactive to light.  Cardiovascular:     Rate and Rhythm: Normal rate and regular rhythm.     Heart sounds: Normal heart sounds. No murmur heard. Pulmonary:     Effort: Pulmonary effort is normal. No respiratory distress.     Breath sounds: Normal breath sounds.  Abdominal:     General: Bowel sounds are normal.     Palpations: Abdomen is soft.  Musculoskeletal:     Right lower leg: No edema.     Left lower leg: No edema.  Skin:    General: Skin is warm and dry.  Neurological:     General: No focal deficit present.     Mental Status: She is alert and oriented to person, place, and time.  Psychiatric:        Mood and Affect: Mood normal.  Behavior: Behavior normal.      No results found for any visits on 08/03/22.  Last CBC Lab Results  Component Value Date   WBC 5.9 07/19/2022   HGB 10.3 (L) 07/19/2022   HCT 30.8 (L) 07/19/2022   MCV 95 07/19/2022   MCH 31.6 07/19/2022   RDW 13.2 07/19/2022   PLT 288 07/19/2022   Last metabolic panel Lab Results  Component Value Date   GLUCOSE 92 07/24/2022   NA 136 07/24/2022   K 4.7 07/24/2022   CL 95 (L) 07/24/2022   CO2 20 07/24/2022   BUN 7 07/24/2022   CREATININE 0.91 07/24/2022   EGFR 73 07/24/2022   CALCIUM 9.1 07/24/2022   PROT 6.8 07/12/2022   ALBUMIN 4.7 07/12/2022   LABGLOB 2.1 07/12/2022   AGRATIO 2.2 07/12/2022   BILITOT 0.4 07/12/2022   ALKPHOS 112 07/12/2022   AST 23 07/12/2022   ALT 20 07/12/2022   ANIONGAP 10 07/13/2022      The ASCVD Risk score (Arnett DK, et al., 2019) failed to calculate for the following reasons:   The valid HDL cholesterol range is 20 to 100 mg/dL     Assessment & Plan:   Courtney Fitzgerald was seen today for medical management of chronic issues.  Diagnoses and all orders for this visit:  Episodic lightheadedness Orthostatic hypotension Tachycardia Hyponatremia Primary hypertension Iron deficiency anemia, unspecified iron deficiency anemia type -     BMP8+EGFR -     Anemia Profile B  Symptoms have improved this week. I do agree that she should continue to work on her endurance for another week with chores/activity at home prior to returning to work on 08/10/22. I discussed the need for fluids with electrolytes while working. I also discussed the need for an additional 15 minute break each work day for the next 2-3 weeks if needed while her endurance improves. Will check labs as above today. Continue monitoring BP at home and notify for elevated or low readings. Zio removed today and she will mail this off today. Will notify patient of results when report is available. She will follow up in 3 months for her chronic follow up, sooner for new or worsening symptoms.   The patient indicates understanding of these issues and agrees with the plan.  [name] M Morgan, FNP  

## 2022-08-04 LAB — ANEMIA PROFILE B
Basophils Absolute: 0.1 10*3/uL (ref 0.0–0.2)
Basos: 1 %
EOS (ABSOLUTE): 0.1 10*3/uL (ref 0.0–0.4)
Eos: 2 %
Ferritin: 93 ng/mL (ref 15–150)
Folate: 6.3 ng/mL (ref >3.0)
Hematocrit: 34.7 % (ref 34.0–46.6)
Hemoglobin: 12 g/dL (ref 11.1–15.9)
Immature Grans (Abs): 0 10*3/uL (ref 0.0–0.1)
Immature Granulocytes: 0 %
Iron Saturation: 18 % (ref 15–55)
Iron: 64 ug/dL (ref 27–159)
Lymphocytes Absolute: 1.3 10*3/uL (ref 0.7–3.1)
Lymphs: 30 %
MCH: 33.4 pg — ABNORMAL HIGH (ref 26.6–33.0)
MCHC: 34.6 g/dL (ref 31.5–35.7)
MCV: 97 fL (ref 79–97)
Monocytes Absolute: 0.4 10*3/uL (ref 0.1–0.9)
Monocytes: 8 %
Neutrophils Absolute: 2.4 10*3/uL (ref 1.4–7.0)
Neutrophils: 59 %
Platelets: 262 10*3/uL (ref 150–450)
RBC: 3.59 x10E6/uL — ABNORMAL LOW (ref 3.77–5.28)
RDW: 13.7 % (ref 11.7–15.4)
Retic Ct Pct: 1.9 % (ref 0.6–2.6)
Total Iron Binding Capacity: 352 ug/dL (ref 250–450)
UIBC: 288 ug/dL (ref 131–425)
Vitamin B-12: 346 pg/mL (ref 232–1245)
WBC: 4.2 10*3/uL (ref 3.4–10.8)

## 2022-08-04 LAB — BMP8+EGFR
BUN/Creatinine Ratio: 11 (ref 9–23)
BUN: 10 mg/dL (ref 6–24)
CO2: 22 mmol/L (ref 20–29)
Calcium: 9.6 mg/dL (ref 8.7–10.2)
Chloride: 99 mmol/L (ref 96–106)
Creatinine, Ser: 0.87 mg/dL (ref 0.57–1.00)
Glucose: 105 mg/dL — ABNORMAL HIGH (ref 70–99)
Potassium: 4.4 mmol/L (ref 3.5–5.2)
Sodium: 135 mmol/L (ref 134–144)
eGFR: 77 mL/min/{1.73_m2} (ref >59)

## 2022-08-12 DIAGNOSIS — R000 Tachycardia, unspecified: Secondary | ICD-10-CM | POA: Diagnosis not present

## 2022-08-12 DIAGNOSIS — R42 Dizziness and giddiness: Secondary | ICD-10-CM | POA: Diagnosis not present

## 2022-10-15 ENCOUNTER — Other Ambulatory Visit: Payer: Self-pay | Admitting: Nurse Practitioner

## 2022-10-15 DIAGNOSIS — D508 Other iron deficiency anemias: Secondary | ICD-10-CM

## 2023-01-08 ENCOUNTER — Other Ambulatory Visit: Payer: Self-pay | Admitting: Family Medicine

## 2023-01-08 DIAGNOSIS — I10 Essential (primary) hypertension: Secondary | ICD-10-CM

## 2023-02-09 ENCOUNTER — Ambulatory Visit: Payer: BC Managed Care – PPO | Admitting: Family Medicine

## 2023-02-26 ENCOUNTER — Other Ambulatory Visit: Payer: Self-pay | Admitting: Family Medicine

## 2023-02-26 DIAGNOSIS — I10 Essential (primary) hypertension: Secondary | ICD-10-CM

## 2023-03-03 ENCOUNTER — Other Ambulatory Visit: Payer: Self-pay | Admitting: Family Medicine

## 2023-03-03 DIAGNOSIS — I10 Essential (primary) hypertension: Secondary | ICD-10-CM

## 2023-03-05 ENCOUNTER — Encounter: Payer: Self-pay | Admitting: Family Medicine

## 2023-03-05 NOTE — Telephone Encounter (Signed)
LMTCB TO SCHEDULE APPT Letter mailed

## 2023-03-05 NOTE — Telephone Encounter (Signed)
[name] NTBS 30 days given 01/09/23

## 2023-03-16 NOTE — Patient Instructions (Signed)
Our records indicate that you are due for your annual mammogram/breast imaging. While there is no way to prevent breast cancer, early detection provides the best opportunity for curing it. For women over the age of 40, the American Cancer Society recommends a yearly clinical breast exam and a yearly mammogram. These practices have saved thousands of lives. We need your help to ensure that you are receiving optimal medical care. Please call the imaging location that has done you previous mammograms. Please remember to list us as your primary care. This helps make sure we receive a report and can update your chart.  Below is the contact information for several local breast imaging centers. You may call the location that works best for you, and they will be happy to assistance in making you an appointment. You do not need an order for a regular screening mammogram. However, if you are having any problems or concerns with you breast area, please let your primary care provider know, and appropriate orders will be placed. Please let our office know if you have any questions or concerns. Or if you need information for another imaging center not on this list or outside of the area. We are commented to working with you on your health care journey.   The mobile unit/bus ([facility]) - they come twice a month to our location.  These appointments can be made through our office or by call The Breast Center  [facility]  [address] [place], [place] [address] Phone [phone]  [facility] Radiology Department  [address]  [place], [place] [address] [phone]  [facility])  [address] [place], [place] [address] [phone]  [facility] [place]  [address] [place] [address] [phone]  [facility]  [address], [address] [place] [address] [phone]  [facility] [place]  [address] , [place] [address] [phone]  [facility] [address] [place], [place] [address] [phone]  [facility] [address]  [address] [place], [place] [address] [phone]  [facility] [address] [place] [address] [phone]     

## 2023-03-19 ENCOUNTER — Encounter: Payer: Self-pay | Admitting: Family Medicine

## 2023-03-19 ENCOUNTER — Ambulatory Visit (INDEPENDENT_AMBULATORY_CARE_PROVIDER_SITE_OTHER): Payer: BC Managed Care – PPO | Admitting: Family Medicine

## 2023-03-19 VITALS — BP 165/86 | HR 87 | Temp 97.8°F | Ht 66.0 in | Wt 181.0 lb

## 2023-03-19 DIAGNOSIS — R42 Dizziness and giddiness: Secondary | ICD-10-CM

## 2023-03-19 DIAGNOSIS — Z1211 Encounter for screening for malignant neoplasm of colon: Secondary | ICD-10-CM | POA: Diagnosis not present

## 2023-03-19 DIAGNOSIS — D508 Other iron deficiency anemias: Secondary | ICD-10-CM | POA: Diagnosis not present

## 2023-03-19 DIAGNOSIS — E871 Hypo-osmolality and hyponatremia: Secondary | ICD-10-CM | POA: Diagnosis not present

## 2023-03-19 LAB — CBC WITH DIFFERENTIAL/PLATELET
Basophils Absolute: 0 10*3/uL (ref 0.0–0.2)
Basos: 1 %
EOS (ABSOLUTE): 0.1 10*3/uL (ref 0.0–0.4)
Eos: 2 %
Hematocrit: 42.3 % (ref 34.0–46.6)
Hemoglobin: 14.8 g/dL (ref 11.1–15.9)
Immature Grans (Abs): 0 10*3/uL (ref 0.0–0.1)
Immature Granulocytes: 0 %
Lymphocytes Absolute: 1.6 10*3/uL (ref 0.7–3.1)
Lymphs: 24 %
MCH: 32.7 pg (ref 26.6–33.0)
MCHC: 35 g/dL (ref 31.5–35.7)
MCV: 93 fL (ref 79–97)
Monocytes Absolute: 0.5 10*3/uL (ref 0.1–0.9)
Monocytes: 7 %
Neutrophils Absolute: 4.4 10*3/uL (ref 1.4–7.0)
Neutrophils: 66 %
Platelets: 262 10*3/uL (ref 150–450)
RBC: 4.53 x10E6/uL (ref 3.77–5.28)
RDW: 12.4 % (ref 11.7–15.4)
WBC: 6.6 10*3/uL (ref 3.4–10.8)

## 2023-03-19 LAB — CMP14+EGFR
ALT: 21 IU/L (ref 0–32)
AST: 21 IU/L (ref 0–40)
Albumin/Globulin Ratio: 2.1 (ref 1.2–2.2)
Albumin: 4.8 g/dL (ref 3.8–4.9)
Alkaline Phosphatase: 127 IU/L — ABNORMAL HIGH (ref 44–121)
BUN/Creatinine Ratio: 13 (ref 9–23)
BUN: 11 mg/dL (ref 6–24)
Bilirubin Total: 0.6 mg/dL (ref 0.0–1.2)
CO2: 23 mmol/L (ref 20–29)
Calcium: 10 mg/dL (ref 8.7–10.2)
Chloride: 97 mmol/L (ref 96–106)
Creatinine, Ser: 0.88 mg/dL (ref 0.57–1.00)
Globulin, Total: 2.3 g/dL (ref 1.5–4.5)
Glucose: 97 mg/dL (ref 70–99)
Potassium: 4.3 mmol/L (ref 3.5–5.2)
Sodium: 137 mmol/L (ref 134–144)
Total Protein: 7.1 g/dL (ref 6.0–8.5)
eGFR: 76 mL/min/{1.73_m2} (ref >59)

## 2023-03-19 LAB — RETICULOCYTES: Retic Ct Pct: 1.9 % (ref 0.6–2.6)

## 2023-03-19 NOTE — Progress Notes (Signed)
Subjective:  Patient ID: Courtney Fitzgerald, female    DOB: Jan 20, 1961  Age: 62 y.o. MRN: [id number]  CC: Dizziness (X 4 DAYS)   HPI Michale Dunkerson presents for dizziness with lightheadedness, heaviness. Similar sx last August. Noted to have low sodium and iron deficiency anemia. Had to have a transfusion. Now taking iron.   Four days ago, standing still at work, got very light headed for 2 hours and had to leave. Drinking propel to stay hydrated.      03/19/2023    9:32 AM 08/03/2022   10:54 AM 07/27/2022   11:22 AM  Depression screen PHQ 2/9  Decreased Interest 2 0 1  Down, Depressed, Hopeless 2 0 0  PHQ - 2 Score 4 0 1  Altered sleeping Tired, decreased energy Change in appetite 1 0 0  Feeling bad or failure about yourself  0 0 0  Trouble concentrating 1 0 0  Moving slowly or fidgety/restless 0 0 1  Suicidal thoughts 0 0 0  PHQ-9 Score Difficult doing work/chores Somewhat difficult Not difficult at all Very difficult    History Bindi has a past medical history of Allergy.   She has a past surgical history that includes Cesarean section; Cryoablation; and ORIF ankle fracture (Left, 02/28/2018).   Her family history includes Cancer in her father, maternal grandmother, mother, and paternal grandmother; Diabetes in her maternal grandfather; Hearing loss in her mother; Heart attack in her father and paternal grandmother; Heart disease in her father; Hypertension in her father.She reports that she has been smoking cigarettes. She has a 9.00 pack-year smoking history. She has never used smokeless tobacco. She reports current alcohol use of about 15.0 standard drinks of alcohol per week. She reports that she does not currently use drugs.    ROS Review of Systems  Constitutional: Negative.   HENT: Negative.    Eyes:  Negative for visual disturbance.  Respiratory:  Negative for shortness of breath.   Cardiovascular:  Negative for chest pain.  Gastrointestinal:  Negative  for abdominal pain.  Musculoskeletal:  Negative for arthralgias.  Neurological:  Positive for dizziness and light-headedness. Negative for syncope and numbness.    Objective:  BP (!) 165/86   Pulse 87   Temp 97.8 F (36.6 C)   Ht  (1.676 m)   Wt 181 lb (82.1 kg)   SpO2 97%   BMI 29.21 kg/m   BP Readings from Last 3 Encounters:  03/19/23 (!) 165/86  08/03/22 122/72  07/27/22 107/61    Wt Readings from Last 3 Encounters:  03/19/23 181 lb (82.1 kg)  08/03/22 171 lb 9.6 oz (77.8 kg)  07/27/22 168 lb 8 oz (76.4 kg)     Physical Exam Constitutional:      General: She is not in acute distress.    Appearance: She is well-developed.  Cardiovascular:     Rate and Rhythm: Normal rate and regular rhythm.  Pulmonary:     Breath sounds: Normal breath sounds.  Musculoskeletal:        General: Normal range of motion.  Skin:    General: Skin is warm and dry.  Neurological:     Mental Status: She is alert and oriented to person, place, and time.      Assessment & Plan:   [place] was seen today for dizziness.  Diagnoses and all orders for this visit:  Hyponatremia -     CBC with Differential/Platelet -  CMP14+EGFR  Other iron deficiency anemia -     CBC with Differential/Platelet -     CMP14+EGFR -     Reticulocytes  Dizziness -     CBC with Differential/Platelet -     CMP14+EGFR  Screen for colon cancer -     Ambulatory referral to Gastroenterology       I am having Vernadette Croll "Allie" maintain her acetaminophen, ibuprofen, meloxicam, atorvastatin, DULoxetine, SV Iron, and losartan.  Allergies as of 03/19/2023       Reactions   Prednisone Nausea And Vomiting        Medication List        Accurate as of March 19, 2023  5:44 PM. If you have any questions, ask your nurse or doctor.          acetaminophen 500 MG tablet Commonly known as: TYLENOL Take 500 mg by mouth every 6 (six) hours as needed.   atorvastatin 20 MG tablet Commonly  known as: LIPITOR Take 1 tablet (20 mg total) by mouth daily.   DULoxetine 30 MG capsule Commonly known as: Cymbalta Take 1 capsule (30 mg total) by mouth daily.   ibuprofen 600 MG tablet Commonly known as: ADVIL Take 600 mg by mouth every 6 (six) hours as needed.   losartan 50 MG tablet Commonly known as: COZAAR Take 1 tablet (50 mg total) by mouth daily. (NEEDS TO BE SEEN BEFORE NEXT REFILL)   meloxicam 15 MG tablet Commonly known as: MOBIC Take 1 tablet (15 mg total) by mouth daily.   SV Iron 325 (65 FE) MG tablet Generic drug: ferrous sulfate Take 1 tablet by mouth once daily with breakfast         Follow-up: Return in about 2 weeks (around 04/02/2023) for with PCP.  Mechele Claude, M.D.

## 2023-03-20 ENCOUNTER — Encounter: Payer: Self-pay | Admitting: *Deleted

## 2023-03-20 ENCOUNTER — Telehealth: Payer: Self-pay | Admitting: Family Medicine

## 2023-03-20 DIAGNOSIS — I10 Essential (primary) hypertension: Secondary | ICD-10-CM

## 2023-03-20 MED ORDER — LOSARTAN POTASSIUM 50 MG PO TABS: 50.0000 mg | ORAL_TABLET | Freq: Every day | ORAL | 0 refills | Status: DC

## 2023-03-20 NOTE — Progress Notes (Signed)
Hello Courtney Fitzgerald,  Your lab result is normal and/or stable.Some minor variations that are not significant are commonly marked abnormal, but do not represent any medical problem for you.  Best regards, Mechele Claude, M.D.

## 2023-03-20 NOTE — Telephone Encounter (Signed)
  Prescription Request  03/20/2023  Is this a "Controlled Substance" medicine? no  Have you seen your PCP in the last 2 weeks? Yes pt has appt on 03/30/23  If YES, route message to pool  -  If NO, patient needs to be scheduled for appointment.  What is the name of the medication or equipment? losartan (COZAAR) 50 MG tablet   Have you contacted your pharmacy to request a refill? yes   Which pharmacy would you like this sent to? [facility]    Patient notified that their request is being sent to the clinical staff for review and that they should receive a response within 2 business days.

## 2023-03-30 ENCOUNTER — Ambulatory Visit (INDEPENDENT_AMBULATORY_CARE_PROVIDER_SITE_OTHER): Payer: BC Managed Care – PPO | Admitting: Family Medicine

## 2023-03-30 ENCOUNTER — Encounter: Payer: Self-pay | Admitting: Family Medicine

## 2023-03-30 VITALS — BP 139/76 | HR 72 | Temp 98.1°F | Ht 66.0 in | Wt 186.4 lb

## 2023-03-30 DIAGNOSIS — D509 Iron deficiency anemia, unspecified: Secondary | ICD-10-CM | POA: Diagnosis not present

## 2023-03-30 DIAGNOSIS — E7800 Pure hypercholesterolemia, unspecified: Secondary | ICD-10-CM

## 2023-03-30 DIAGNOSIS — F330 Major depressive disorder, recurrent, mild: Secondary | ICD-10-CM

## 2023-03-30 DIAGNOSIS — I10 Essential (primary) hypertension: Secondary | ICD-10-CM | POA: Diagnosis not present

## 2023-03-30 DIAGNOSIS — F419 Anxiety disorder, unspecified: Secondary | ICD-10-CM

## 2023-03-30 MED ORDER — ATORVASTATIN CALCIUM 20 MG PO TABS: 20.0000 mg | ORAL_TABLET | Freq: Every day | ORAL | 3 refills | Status: DC

## 2023-03-30 MED ORDER — LOSARTAN POTASSIUM 50 MG PO TABS: 50.0000 mg | ORAL_TABLET | Freq: Every day | ORAL | 1 refills | Status: DC

## 2023-03-30 MED ORDER — DULOXETINE HCL 30 MG PO CPEP: 30.0000 mg | ORAL_CAPSULE | Freq: Every day | ORAL | 3 refills | Status: DC

## 2023-03-30 NOTE — Progress Notes (Signed)
Established Patient Office Visit  Subjective   Patient ID: Courtney Fitzgerald, female    DOB: [birth year]  Age: 62 y.o. MRN: [id number]  Chief Complaint  Patient presents with   Medical Management of Chronic Issues    HPI HTN Complaint with meds - recently restarted on medication last week after Courtney Fitzgerald had been out for awhile Current Medications - losartan 50 mg  Checking BP at home ranging 130s/70s Pertinent ROS:  Headache - resolved since restarting medications Fatigue - No Visual Disturbances - No Chest pain - No Dyspnea - No Palpitations - No LE edema - No  2. HLD Compliant with statin. Denies side effect. Regular diet, active at work.   3. IDA Compliant with iron supplement.   4. Anxiety/depression Reports well controlled with Cymbalta.       03/30/2023   10:30 AM 03/19/2023    9:32 AM 08/03/2022   10:54 AM  Depression screen PHQ 2/9  Decreased Interest 0 2 0  Down, Depressed, Hopeless 1 2 0  PHQ - 2 Score 1 4 0  Altered sleeping 2 3 1   Tired, decreased energy 1 2 1   Change in appetite 0 1 0  Feeling bad or failure about yourself  0 0 0  Trouble concentrating 0 1 0  Moving slowly or fidgety/restless 0 0 0  Suicidal thoughts 0 0 0  PHQ-9 Score 4 11 2   Difficult doing work/chores Somewhat difficult Somewhat difficult Not difficult at all      03/30/2023   10:30 AM 03/19/2023    9:33 AM 07/27/2022   11:24 AM 07/19/2022   11:24 AM  GAD 7 : Generalized Anxiety Score  Nervous, Anxious, on Edge 0 0 0 1  Control/stop worrying 0 1 1 2   Worry too much - different things 0 1 1 2   Trouble relaxing 0 1 0 1  Restless 0 0 0 0  Easily annoyed or irritable 0 1 0 1  Afraid - awful might happen 0 0 0 0  Total GAD 7 Score 0 4 2 7   Anxiety Difficulty Not difficult at all Somewhat difficult Somewhat difficult Somewhat difficult     Past Medical History:  Diagnosis Date   Allergy       ROS Negative unless specially indicated above in HPI.  Objective:     BP 139/76  Comment: at home reading per pt  Pulse 72   Temp 98.1 F (36.7 C) (Temporal)   Ht 5\' 6"  (1.676 m)   Wt 186 lb 6 oz (84.5 kg)   SpO2 98%   BMI 30.08 kg/m    Physical Exam Vitals and nursing note reviewed.  Constitutional:      General: Courtney Fitzgerald is not in acute distress.    Appearance: Normal appearance. Courtney Fitzgerald is not ill-appearing, toxic-appearing or diaphoretic.  HENT:     Nose: Nose normal.     Mouth/Throat:     Mouth: Mucous membranes are moist.     Pharynx: Oropharynx is clear.  Eyes:     Extraocular Movements: Extraocular movements intact.     Pupils: Pupils are equal, round, and reactive to light.  Neck:     Vascular: No carotid bruit.  Cardiovascular:     Rate and Rhythm: Normal rate and regular rhythm.     Heart sounds: Normal heart sounds. No murmur heard. Pulmonary:     Effort: Pulmonary effort is normal. No respiratory distress.     Breath sounds: Normal breath sounds.  Abdominal:  General: Bowel sounds are normal.  Musculoskeletal:     Cervical back: Neck supple. No rigidity.     Right lower leg: No edema.     Left lower leg: No edema.  Skin:    General: Skin is warm and dry.  Neurological:     General: No focal deficit present.     Mental Status: Courtney Fitzgerald is alert and oriented to person, place, and time.  Psychiatric:        Mood and Affect: Mood normal.        Behavior: Behavior normal.      No results found for any visits on 03/30/23.    The ASCVD Risk score (Arnett DK, et al., 2019) failed to calculate for the following reasons:   The valid HDL cholesterol range is 20 to 100 mg/dL    Assessment & Plan:   Courtney Fitzgerald was seen today for medical management of chronic issues.  Diagnoses and all orders for this visit:  Primary hypertension Well controlled on current regimen. Discussed compliance with regimen.  -     losartan (COZAAR) 50 MG tablet; Take 1 tablet (50 mg total) by mouth daily.  Iron deficiency anemia, unspecified iron deficiency anemia  type Recent H&H was normal. Continue iron supplement.   Mild recurrent major depression (HCC) Anxiety Well controlled on current regimen.  -     DULoxetine (CYMBALTA) 30 MG capsule; Take 1 capsule (30 mg total) by mouth daily.  Pure hypercholesterolemia Last LDL was 61. Continue statin, diet, and exercise.  -     atorvastatin (LIPITOR) 20 MG tablet; Take 1 tablet (20 mg total) by mouth daily.  Return in about 3 months (around 06/30/2023) for chronic follow up.   The patient indicates understanding of these issues and agrees with the plan.  Gabriel Earing, FNP

## 2023-06-15 DIAGNOSIS — H40023 Open angle with borderline findings, high risk, bilateral: Secondary | ICD-10-CM | POA: Diagnosis not present

## 2023-06-19 DIAGNOSIS — H409 Unspecified glaucoma: Secondary | ICD-10-CM | POA: Diagnosis not present

## 2023-06-19 DIAGNOSIS — H538 Other visual disturbances: Secondary | ICD-10-CM | POA: Diagnosis not present

## 2023-06-28 NOTE — Patient Instructions (Signed)

## 2023-06-29 ENCOUNTER — Ambulatory Visit (INDEPENDENT_AMBULATORY_CARE_PROVIDER_SITE_OTHER): Payer: BC Managed Care – PPO | Admitting: Family Medicine

## 2023-06-29 ENCOUNTER — Encounter: Payer: Self-pay | Admitting: Family Medicine

## 2023-06-29 VITALS — BP 130/70 | HR 79 | Temp 98.2°F | Ht 66.0 in | Wt 193.2 lb

## 2023-06-29 DIAGNOSIS — H409 Unspecified glaucoma: Secondary | ICD-10-CM

## 2023-06-29 DIAGNOSIS — F419 Anxiety disorder, unspecified: Secondary | ICD-10-CM

## 2023-06-29 DIAGNOSIS — M62838 Other muscle spasm: Secondary | ICD-10-CM

## 2023-06-29 DIAGNOSIS — E7800 Pure hypercholesterolemia, unspecified: Secondary | ICD-10-CM

## 2023-06-29 DIAGNOSIS — F330 Major depressive disorder, recurrent, mild: Secondary | ICD-10-CM

## 2023-06-29 DIAGNOSIS — Z114 Encounter for screening for human immunodeficiency virus [HIV]: Secondary | ICD-10-CM

## 2023-06-29 DIAGNOSIS — M79672 Pain in left foot: Secondary | ICD-10-CM

## 2023-06-29 DIAGNOSIS — I10 Essential (primary) hypertension: Secondary | ICD-10-CM | POA: Diagnosis not present

## 2023-06-29 DIAGNOSIS — D509 Iron deficiency anemia, unspecified: Secondary | ICD-10-CM | POA: Insufficient documentation

## 2023-06-29 DIAGNOSIS — H269 Unspecified cataract: Secondary | ICD-10-CM

## 2023-06-29 DIAGNOSIS — Z1159 Encounter for screening for other viral diseases: Secondary | ICD-10-CM

## 2023-06-29 DIAGNOSIS — M79671 Pain in right foot: Secondary | ICD-10-CM

## 2023-06-29 MED ORDER — CYCLOBENZAPRINE HCL 10 MG PO TABS: 10.0000 mg | ORAL_TABLET | Freq: Three times a day (TID) | ORAL | 0 refills | Status: DC | PRN

## 2023-06-29 NOTE — Progress Notes (Signed)
Established Patient Office Visit  Subjective   Patient ID: Courtney Fitzgerald, female    DOB: 1960-12-20  Age: 62 y.o. MRN: [id number]  Chief Complaint  Patient presents with   Medical Management of Chronic Issues   Hypertension    HPI HTN Complaint with meds - Yes Current Medications - losartan Checking BP at home ranging <130/80s Pertinent ROS:  Headache - No Fatigue - No Visual Disturbances - yes, see below.  Chest pain - No Dyspnea - No Palpitations - No LE edema - No  She recently went to the ER for bilateral blurred vision. She has been diagnosed with bilateral glaucoma and cataracts. They are working to lower her pressures and then will have surgery. She has been following up with opthalmology.   2. HLD Not fasting today. On statin. Regular diet.   3. Anemia Continues on daily iron supplement. Denies dizziness, cold interolarance, fatigue.   4. Joint pain She reports bilateral foot pain, ankle pain in her left ankle, and left sided neck pain. This has been off and on for awhile. She has been taking Excedrin migraine with mild relief. Has muscle spasms in her neck sometimes. No numbness, tingling, or weakness.      06/29/2023   11:30 AM 03/30/2023   10:30 AM 03/19/2023    9:32 AM  Depression screen PHQ 2/9  Decreased Interest 0 0 2  Down, Depressed, Hopeless 0 1 2  PHQ - 2 Score 0 1 4  Altered sleeping 1 2 3   Tired, decreased energy 0 1 2  Change in appetite 0 0 1  Feeling bad or failure about yourself  0 0 0  Trouble concentrating 0 0 1  Moving slowly or fidgety/restless 0 0 0  Suicidal thoughts 0 0 0  PHQ-9 Score 1 4 11   Difficult doing work/chores Not difficult at all Somewhat difficult Somewhat difficult      06/29/2023   11:30 AM 03/30/2023   10:30 AM 03/19/2023    9:33 AM 07/27/2022   11:24 AM  GAD 7 : Generalized Anxiety Score  Nervous, Anxious, on Edge 0 0 0 0  Control/stop worrying 0 0 1 1  Worry too much - different things 1 0 1 1  Trouble relaxing 0 0  1 0  Restless 0 0 0 0  Easily annoyed or irritable 0 0 1 0  Afraid - awful might happen 0 0 0 0  Total GAD 7 Score 1 0 4 2  Anxiety Difficulty Not difficult at all Not difficult at all Somewhat difficult Somewhat difficult       ROS As per HPI.    Objective:     BP 130/70   Pulse 79   Temp 98.2 F (36.8 C) (Temporal)   Ht 5\' 6"  (1.676 m)   Wt 193 lb 4 oz (87.7 kg)   SpO2 96%   BMI 31.19 kg/m    Physical Exam Vitals and nursing note reviewed.  Constitutional:      General: She is not in acute distress.    Appearance: Normal appearance. She is not ill-appearing, toxic-appearing or diaphoretic.  Neck:     Thyroid: No thyroid mass, thyromegaly or thyroid tenderness.  Cardiovascular:     Rate and Rhythm: Normal rate and regular rhythm.     Heart sounds: Normal heart sounds. No murmur heard. Pulmonary:     Effort: Pulmonary effort is normal. No respiratory distress.     Breath sounds: Normal breath sounds. No wheezing.  Musculoskeletal:  Cervical back: Neck supple. No rigidity.     Right lower leg: No edema.     Left lower leg: No edema.  Skin:    General: Skin is warm and dry.  Neurological:     General: No focal deficit present.     Mental Status: She is alert and oriented to person, place, and time.  Psychiatric:        Mood and Affect: Mood normal.        Behavior: Behavior normal.      No results found for any visits on 06/29/23.    The ASCVD Risk score (Arnett DK, et al., 2019) failed to calculate for the following reasons:   The valid HDL cholesterol range is 20 to 100 mg/dL    Assessment & Plan:   Courtney Fitzgerald" was seen today for medical management of chronic issues and hypertension.  Diagnoses and all orders for this visit:  Primary hypertension Well controlled on current regimen.  -     BMP8+EGFR; Future  Pure hypercholesterolemia She will return for fasting labs. Last LDL at goal. On statin.  -     Lipid panel; Future  Iron  deficiency anemia, unspecified iron deficiency anemia type On iron supplement.  -     Anemia Profile B; Future  Mild recurrent major depression (HCC) Anxiety Well controlled on current regimen.   Glaucoma of both eyes, unspecified glaucoma type Cataract of both eyes, unspecified cataract type Continue follow up with ophthalmology.   Muscle spasm Try flexeril prn.  -     cyclobenzaprine (FLEXERIL) 10 MG tablet; Take 1 tablet (10 mg total) by mouth 3 (three) times daily as needed for muscle spasms.  Bilateral foot pain Discussed tylenol, supportive shoes.   Encounter for screening for HIV -     HIV Antibody (routine testing w rflx); Future  Need for hepatitis C screening test -     Hepatitis C antibody; Future   Return in about 6 months (around 12/30/2023).   The patient indicates understanding of these issues and agrees with the plan.  Gabriel Earing, FNP

## 2023-09-19 ENCOUNTER — Encounter: Payer: Self-pay | Admitting: *Deleted

## 2023-10-02 ENCOUNTER — Encounter: Payer: Self-pay | Admitting: Family Medicine

## 2023-10-08 DIAGNOSIS — H401133 Primary open-angle glaucoma, bilateral, severe stage: Secondary | ICD-10-CM | POA: Diagnosis not present

## 2023-10-08 DIAGNOSIS — H2513 Age-related nuclear cataract, bilateral: Secondary | ICD-10-CM | POA: Diagnosis not present

## 2023-10-08 DIAGNOSIS — H524 Presbyopia: Secondary | ICD-10-CM | POA: Diagnosis not present

## 2023-10-08 DIAGNOSIS — Q141 Congenital malformation of retina: Secondary | ICD-10-CM | POA: Diagnosis not present

## 2023-10-22 DIAGNOSIS — H401133 Primary open-angle glaucoma, bilateral, severe stage: Secondary | ICD-10-CM | POA: Diagnosis not present

## 2023-11-16 ENCOUNTER — Telehealth: Payer: Self-pay

## 2023-11-16 NOTE — Progress Notes (Signed)
 [facility] Assistant attempted to call patient on today regarding preventative mammogram screening. No answer from patient after multiple rings. Assistant left confidential voicemail for patient to return call.  Will call back patient back for final attempt.   Baruch Gouty [place]/VBCI  [facility] Assistant-Population Health [phone]

## 2024-02-18 ENCOUNTER — Other Ambulatory Visit: Payer: Self-pay | Admitting: Family Medicine

## 2024-02-18 DIAGNOSIS — I10 Essential (primary) hypertension: Secondary | ICD-10-CM

## 2024-04-11 DIAGNOSIS — H401133 Primary open-angle glaucoma, bilateral, severe stage: Secondary | ICD-10-CM | POA: Diagnosis not present

## 2024-04-11 DIAGNOSIS — H2513 Age-related nuclear cataract, bilateral: Secondary | ICD-10-CM | POA: Diagnosis not present

## 2024-04-11 DIAGNOSIS — H52213 Irregular astigmatism, bilateral: Secondary | ICD-10-CM | POA: Diagnosis not present

## 2024-04-11 DIAGNOSIS — H524 Presbyopia: Secondary | ICD-10-CM | POA: Diagnosis not present

## 2024-04-11 DIAGNOSIS — Q141 Congenital malformation of retina: Secondary | ICD-10-CM | POA: Diagnosis not present

## 2024-04-14 ENCOUNTER — Other Ambulatory Visit: Payer: Self-pay | Admitting: Family Medicine

## 2024-04-14 DIAGNOSIS — F419 Anxiety disorder, unspecified: Secondary | ICD-10-CM

## 2024-04-14 DIAGNOSIS — E7800 Pure hypercholesterolemia, unspecified: Secondary | ICD-10-CM

## 2024-04-14 DIAGNOSIS — F330 Major depressive disorder, recurrent, mild: Secondary | ICD-10-CM

## 2024-04-28 ENCOUNTER — Ambulatory Visit (INDEPENDENT_AMBULATORY_CARE_PROVIDER_SITE_OTHER): Admitting: Family Medicine

## 2024-04-28 ENCOUNTER — Encounter: Payer: Self-pay | Admitting: Family Medicine

## 2024-04-28 VITALS — BP 127/84 | HR 80 | Temp 97.1°F | Ht 66.0 in | Wt 196.6 lb

## 2024-04-28 DIAGNOSIS — Z114 Encounter for screening for human immunodeficiency virus [HIV]: Secondary | ICD-10-CM

## 2024-04-28 DIAGNOSIS — Z0000 Encounter for general adult medical examination without abnormal findings: Secondary | ICD-10-CM

## 2024-04-28 DIAGNOSIS — I10 Essential (primary) hypertension: Secondary | ICD-10-CM | POA: Diagnosis not present

## 2024-04-28 DIAGNOSIS — D509 Iron deficiency anemia, unspecified: Secondary | ICD-10-CM | POA: Diagnosis not present

## 2024-04-28 DIAGNOSIS — E7800 Pure hypercholesterolemia, unspecified: Secondary | ICD-10-CM

## 2024-04-28 DIAGNOSIS — Z1159 Encounter for screening for other viral diseases: Secondary | ICD-10-CM

## 2024-04-28 DIAGNOSIS — F330 Major depressive disorder, recurrent, mild: Secondary | ICD-10-CM

## 2024-04-28 DIAGNOSIS — F419 Anxiety disorder, unspecified: Secondary | ICD-10-CM

## 2024-04-28 DIAGNOSIS — M62838 Other muscle spasm: Secondary | ICD-10-CM

## 2024-04-28 DIAGNOSIS — Z0001 Encounter for general adult medical examination with abnormal findings: Secondary | ICD-10-CM

## 2024-04-28 MED ORDER — LOSARTAN POTASSIUM 50 MG PO TABS
50.0000 mg | ORAL_TABLET | Freq: Every day | ORAL | 3 refills | Status: AC
Start: 2024-04-28 — End: ?

## 2024-04-28 MED ORDER — ATORVASTATIN CALCIUM 20 MG PO TABS: 20.0000 mg | ORAL_TABLET | Freq: Every day | ORAL | 0 refills | Status: DC

## 2024-04-28 MED ORDER — CYCLOBENZAPRINE HCL 10 MG PO TABS
10.0000 mg | ORAL_TABLET | Freq: Three times a day (TID) | ORAL | 0 refills | Status: AC | PRN
Start: 2024-04-28 — End: ?

## 2024-04-28 MED ORDER — DULOXETINE HCL 20 MG PO CPEP
40.0000 mg | ORAL_CAPSULE | Freq: Every day | ORAL | 3 refills | Status: AC
Start: 2024-04-28 — End: ?

## 2024-04-28 NOTE — Progress Notes (Signed)
 Complete physical exam  Patient: Courtney Fitzgerald   DOB: 03-Oct-1961   63 y.o. Female  MRN: [id number]  Subjective:     Chief Complaint  Patient presents with   Annual Exam    Courtney Fitzgerald is a 63 y.o. female who presents today for a complete physical exam. She reports consuming a general diet. Home exercise routine includes mowing the yard with a push mower. She generally feels well. She reports sleeping well. She does not have additional problems to discuss today.   She continues to have irritability with difficulty managing this. Would like to try a higher cymbalta  dosage.   Declines pap today but will schedule at a later date.   Most recent fall risk assessment:    03/30/2023   10:29 AM  Fall Risk   Falls in the past year? 1  Number falls in past yr: 0  Injury with Fall? 0  Risk for fall due to : History of fall(s)  Follow up Falls evaluation completed     Most recent depression screenings:    06/29/2023   11:30 AM 03/30/2023   10:30 AM 03/19/2023    9:32 AM  Depression screen PHQ 2/9  Decreased Interest 0 0 2  Down, Depressed, Hopeless 0 1 2  PHQ - 2 Score 0 1 4  Altered sleeping 1 2 3   Tired, decreased energy 0 1 2  Change in appetite 0 0 1  Feeling bad or failure about yourself  0 0 0  Trouble concentrating 0 0 1  Moving slowly or fidgety/restless 0 0 0  Suicidal thoughts 0 0 0  PHQ-9 Score 1 4 11   Difficult doing work/chores Not difficult at all Somewhat difficult Somewhat difficult      06/29/2023   11:30 AM 03/30/2023   10:30 AM 03/19/2023    9:33 AM 07/27/2022   11:24 AM  GAD 7 : Generalized Anxiety Score  Nervous, Anxious, on Edge 0 0 0 0  Control/stop worrying 0 0 1 1  Worry too much - different things 1 0 1 1  Trouble relaxing 0 0 1 0  Restless 0 0 0 0  Easily annoyed or irritable 0 0 1 0  Afraid - awful might happen 0 0 0 0  Total GAD 7 Score 1 0 4 2  Anxiety Difficulty Not difficult at all Not difficult at all Somewhat difficult Somewhat difficult       Vision:Within last year and Dental: No current dental problems and No regular dental care     Patient Care Team: Albertha Huger, FNP as PCP - General (Family Medicine)   Outpatient Medications Prior to Visit  Medication Sig   aspirin-acetaminophen -caffeine (EXCEDRIN MIGRAINE) 250-250-65 MG tablet Take by mouth every 6 (six) hours as needed for headache.   atorvastatin  (LIPITOR) 20 MG tablet Take 1 tablet (20 mg total) by mouth daily. **NEEDS TO BE SEEN BEFORE NEXT REFILL**   dorzolamide-timolol (COSOPT) 2-0.5 % ophthalmic solution 1 drop 2 (two) times daily.   DULoxetine  (CYMBALTA ) 30 MG capsule Take 1 capsule (30 mg total) by mouth daily. **NEEDS TO BE SEEN BEFORE NEXT REFILL**   ferrous sulfate  (SV IRON) 325 (65 FE) MG tablet Take 1 tablet by mouth once daily with breakfast   latanoprost (XALATAN) 0.005 % ophthalmic solution SMARTSIG:In Eye(s)   losartan  (COZAAR ) 50 MG tablet Take 1 tablet (50 mg total) by mouth daily. **NEEDS TO BE SEEN BEFORE NEXT REFILL**   cyclobenzaprine  (FLEXERIL ) 10 MG tablet Take 1 tablet (  10 mg total) by mouth 3 (three) times daily as needed for muscle spasms. (Patient not taking: Reported on 04/28/2024)   No facility-administered medications prior to visit.    ROS Negative unless specially indicated above in HPI.     Objective:     BP 127/84   Pulse 80   Temp (!) 97.1 F (36.2 C) (Temporal)   Ht 5\' 6"  (1.676 m)   Wt 196 lb 9.6 oz (89.2 kg)   SpO2 96%   BMI 31.73 kg/m    Physical Exam Vitals and nursing note reviewed.  Constitutional:      General: She is not in acute distress.    Appearance: Normal appearance. She is not ill-appearing, toxic-appearing or diaphoretic.  HENT:     Head: Normocephalic.     Right Ear: Tympanic membrane, ear canal and external ear normal.     Left Ear: Tympanic membrane, ear canal and external ear normal.     Nose: Nose normal.     Mouth/Throat:     Mouth: Mucous membranes are moist.     Pharynx:  Oropharynx is clear.  Eyes:     Extraocular Movements: Extraocular movements intact.     Conjunctiva/sclera: Conjunctivae normal.     Pupils: Pupils are equal, round, and reactive to light.  Neck:     Thyroid : No thyroid  mass, thyromegaly or thyroid  tenderness.  Cardiovascular:     Rate and Rhythm: Normal rate and regular rhythm.     Pulses: Normal pulses.     Heart sounds: Normal heart sounds. No murmur heard.    No friction rub. No gallop.  Pulmonary:     Effort: Pulmonary effort is normal.     Breath sounds: Normal breath sounds.  Abdominal:     General: Bowel sounds are normal. There is no distension.     Palpations: Abdomen is soft. There is no mass.     Tenderness: There is no abdominal tenderness. There is no guarding.  Musculoskeletal:     Cervical back: Normal range of motion and neck supple. No tenderness.     Right lower leg: No edema.     Left lower leg: No edema.  Skin:    General: Skin is warm and dry.     Capillary Refill: Capillary refill takes less than 2 seconds.     Findings: No lesion or rash.  Neurological:     General: No focal deficit present.     Mental Status: She is alert and oriented to person, place, and time.     Cranial Nerves: No cranial nerve deficit.     Motor: No weakness.     Coordination: Coordination normal.     Gait: Gait normal.  Psychiatric:        Mood and Affect: Mood normal.        Behavior: Behavior normal.        Thought Content: Thought content normal.        Judgment: Judgment normal.      No results found for any visits on 04/28/24.     Assessment & Plan:    Routine Health Maintenance and Physical Exam  Courtney "Allie" was seen today for annual exam.  Diagnoses and all orders for this visit:  Routine general medical examination at a health care facility  Primary hypertension Well controlled on current regimen.  -     CBC with Differential/Platelet -     CMP14+EGFR -     TSH -  losartan  (COZAAR ) 50 MG tablet;  Take 1 tablet (50 mg total) by mouth daily.  Pure hypercholesterolemia Fasting lipid panel pending.  -     Lipid panel -     atorvastatin  (LIPITOR) 20 MG tablet; Take 1 tablet (20 mg total) by mouth daily.  Iron deficiency anemia, unspecified iron deficiency anemia type On iron supplement.  -     Iron, TIBC and Ferritin Panel  Mild recurrent major depression (HCC) Well controlled on current regimen.  -     DULoxetine  (CYMBALTA ) 20 MG capsule; Take 2 capsules (40 mg total) by mouth daily.  Anxiety Will increase to 40 mg daily to see if this improves irritability.  -     DULoxetine  (CYMBALTA ) 20 MG capsule; Take 2 capsules (40 mg total) by mouth daily.  Need for hepatitis C screening test -     HCV Ab w Reflex to Quant PCR  Encounter for screening for HIV -     HIV Antibody (routine testing w rflx)  Muscle spasm Well controlled on current regimen.  -     cyclobenzaprine  (FLEXERIL ) 10 MG tablet; Take 1 tablet (10 mg total) by mouth 3 (three) times daily as needed for muscle spasms.    Immunization History  Administered Date(s) Administered   Tdap 02/25/2021    Health Maintenance  Topic Date Due   HIV Screening  Never done   Hepatitis C Screening  Never done   Pneumococcal Vaccine 58-29 Years old (1 of 2 - PCV) Never done   Cervical Cancer Screening (HPV/Pap Cotest)  Never done   MAMMOGRAM  06/28/2024 (Originally 11/03/2013)   Colonoscopy  06/28/2024 (Originally 11/03/2008)   COVID-19 Vaccine (1 - 2024-25 season) 05/14/2025 (Originally 07/29/2023)   Zoster Vaccines- Shingrix (1 of 2) 07/29/2025 (Originally 11/03/2013)   INFLUENZA VACCINE  06/27/2024   DTaP/Tdap/Td (2 - Td or Tdap) 02/26/2031   HPV VACCINES  Aged Out   Meningococcal B Vaccine  Aged Out    Discussed health benefits of physical activity, and encouraged her to engage in regular exercise appropriate for her age and condition.  Problem List Items Addressed This Visit       Cardiovascular and Mediastinum    Primary hypertension   Relevant Medications   atorvastatin  (LIPITOR) 20 MG tablet   losartan  (COZAAR ) 50 MG tablet   Other Relevant Orders   CBC with Differential/Platelet   CMP14+EGFR   TSH     Other   Pure hypercholesterolemia   Relevant Medications   atorvastatin  (LIPITOR) 20 MG tablet   losartan  (COZAAR ) 50 MG tablet   Other Relevant Orders   Lipid panel   Iron deficiency anemia   Relevant Orders   Iron, TIBC and Ferritin Panel   Mild recurrent major depression (HCC)   Relevant Medications   DULoxetine  (CYMBALTA ) 20 MG capsule   Other Visit Diagnoses       Routine general medical examination at a health care facility    -  Primary     Need for hepatitis C screening test       Relevant Orders   HCV Ab w Reflex to Quant PCR     Encounter for screening for HIV       Relevant Orders   HIV Antibody (routine testing w rflx)     Muscle spasm       Relevant Medications   cyclobenzaprine  (FLEXERIL ) 10 MG tablet     Anxiety       Relevant Medications   DULoxetine  (CYMBALTA )  20 MG capsule      Return in about 6 months (around 10/28/2024) for PAP and chronic follow up.   The patient indicates understanding of these issues and agrees with the plan.  Albertha Huger, FNP

## 2024-04-28 NOTE — Patient Instructions (Signed)
 Health Maintenance, Female Adopting a healthy lifestyle and getting preventive care are important in promoting health and wellness. Ask your health care provider about: The right schedule for you to have regular tests and exams. Things you can do on your own to prevent diseases and keep yourself healthy. What should I know about diet, weight, and exercise? Eat a healthy diet  Eat a diet that includes plenty of vegetables, fruits, low-fat dairy products, and lean protein. Do not eat a lot of foods that are high in solid fats, added sugars, or sodium. Maintain a healthy weight Body mass index (BMI) is used to identify weight problems. It estimates body fat based on height and weight. Your health care provider can help determine your BMI and help you achieve or maintain a healthy weight. Get regular exercise Get regular exercise. This is one of the most important things you can do for your health. Most adults should: Exercise for at least 150 minutes each week. The exercise should increase your heart rate and make you sweat (moderate-intensity exercise). Do strengthening exercises at least twice a week. This is in addition to the moderate-intensity exercise. Spend less time sitting. Even light physical activity can be beneficial. Watch cholesterol and blood lipids Have your blood tested for lipids and cholesterol at 61 years of age, then have this test every 5 years. Have your cholesterol levels checked more often if: Your lipid or cholesterol levels are high. You are older than 61 years of age. You are at high risk for heart disease. What should I know about cancer screening? Depending on your health history and family history, you may need to have cancer screening at various ages. This may include screening for: Breast cancer. Cervical cancer. Colorectal cancer. Skin cancer. Lung cancer. What should I know about heart disease, diabetes, and high blood pressure? Blood pressure and heart  disease High blood pressure causes heart disease and increases the risk of stroke. This is more likely to develop in people who have high blood pressure readings or are overweight. Have your blood pressure checked: Every 3-5 years if you are 78-52 years of age. Every year if you are 61 years old or older. Diabetes Have regular diabetes screenings. This checks your fasting blood sugar level. Have the screening done: Once every three years after age 32 if you are at a normal weight and have a low risk for diabetes. More often and at a younger age if you are overweight or have a high risk for diabetes. What should I know about preventing infection? Hepatitis B If you have a higher risk for hepatitis B, you should be screened for this virus. Talk with your health care provider to find out if you are at risk for hepatitis B infection. Hepatitis C Testing is recommended for: Everyone born from [birth year] through 1963. Anyone with known risk factors for hepatitis C. Sexually transmitted infections (STIs) Get screened for STIs, including gonorrhea and chlamydia, if: You are sexually active and are younger than 61 years of age. You are older than 61 years of age and your health care provider tells you that you are at risk for this type of infection. Your sexual activity has changed since you were last screened, and you are at increased risk for chlamydia or gonorrhea. Ask your health care provider if you are at risk. Ask your health care provider about whether you are at high risk for HIV. Your health care provider may recommend a prescription medicine to help prevent HIV  infection. If you choose to take medicine to prevent HIV, you should first get tested for HIV. You should then be tested every 3 months for as long as you are taking the medicine. Pregnancy If you are about to stop having your period (premenopausal) and you may become pregnant, seek counseling before you get pregnant. Take 400 to 800  micrograms (mcg) of folic acid every day if you become pregnant. Ask for birth control (contraception) if you want to prevent pregnancy. Osteoporosis and menopause Osteoporosis is a disease in which the bones lose minerals and strength with aging. This can result in bone fractures. If you are 71 years old or older, or if you are at 61 risk for osteoporosis and fractures, ask your health care provider if you should: Be screened for bone loss. Take a calcium or vitamin D supplement to lower your risk of fractures. Be given hormone replacement therapy (HRT) to treat symptoms of menopause. Follow these instructions at home: Alcohol use Do not drink alcohol if: Your health care provider tells you not to drink. You are pregnant, may be pregnant, or are planning to become pregnant. If you drink alcohol: Limit how much you have to: 0-1 drink a day. Know how much alcohol is in your drink. In the [place] one drink equals one 12 oz bottle of beer (355 mL), one 5 oz glass of wine (148 mL), or one 1 oz glass of hard liquor (44 mL). Lifestyle Do not use any products that contain nicotine or tobacco. These products include cigarettes, chewing tobacco, and vaping devices, such as e-cigarettes. If you need help quitting, ask your health care provider. Do not use street drugs. Do not share needles. Ask your health care provider for help if you need support or information about quitting drugs. General instructions Schedule regular health, dental, and eye exams. Stay current with your vaccines. Tell your health care provider if: You often feel depressed. You have ever been abused or do not feel safe at home. Summary Adopting a healthy lifestyle and getting preventive care are important in promoting health and wellness. Follow your health care provider's instructions about healthy diet, exercising, and getting tested or screened for diseases. Follow your health care provider's instructions on monitoring your  cholesterol and blood pressure. This information is not intended to replace advice given to you by your health care provider. Make sure you discuss any questions you have with your health care provider. Document Revised: 04/04/2021 Document Reviewed: 04/04/2021 Elsevier Patient Education  2024 [employer].

## 2024-04-29 LAB — CMP14+EGFR
ALT: 16 IU/L (ref 0–32)
AST: 17 IU/L (ref 0–40)
Albumin: 4.7 g/dL (ref 3.8–4.9)
Alkaline Phosphatase: 106 IU/L (ref 44–121)
BUN/Creatinine Ratio: 13 (ref 12–28)
BUN: 11 mg/dL (ref 8–27)
Bilirubin Total: 0.3 mg/dL (ref 0.0–1.2)
CO2: 22 mmol/L (ref 20–29)
Calcium: 9.9 mg/dL (ref 8.7–10.3)
Chloride: 100 mmol/L (ref 96–106)
Creatinine, Ser: 0.82 mg/dL (ref 0.57–1.00)
Globulin, Total: 2.3 g/dL (ref 1.5–4.5)
Glucose: 61 mg/dL — ABNORMAL LOW (ref 70–99)
Potassium: 4.8 mmol/L (ref 3.5–5.2)
Sodium: 136 mmol/L (ref 134–144)
Total Protein: 7 g/dL (ref 6.0–8.5)
eGFR: 82 mL/min/{1.73_m2} (ref >59)

## 2024-04-29 LAB — IRON,TIBC AND FERRITIN PANEL
Ferritin: 109 ng/mL (ref 15–150)
Iron Saturation: 34 % (ref 15–55)
Iron: 99 ug/dL (ref 27–159)
Total Iron Binding Capacity: 290 ug/dL (ref 250–450)
UIBC: 191 ug/dL (ref 131–425)

## 2024-04-29 LAB — CBC WITH DIFFERENTIAL/PLATELET
Basophils Absolute: 0 10*3/uL (ref 0.0–0.2)
Basos: 1 %
EOS (ABSOLUTE): 0.2 10*3/uL (ref 0.0–0.4)
Eos: 4 %
Hematocrit: 40.6 % (ref 34.0–46.6)
Hemoglobin: 13.5 g/dL (ref 11.1–15.9)
Immature Grans (Abs): 0 10*3/uL (ref 0.0–0.1)
Immature Granulocytes: 0 %
Lymphocytes Absolute: 1.6 10*3/uL (ref 0.7–3.1)
Lymphs: 31 %
MCH: 32.1 pg (ref 26.6–33.0)
MCHC: 33.3 g/dL (ref 31.5–35.7)
MCV: 97 fL (ref 79–97)
Monocytes Absolute: 0.4 10*3/uL (ref 0.1–0.9)
Monocytes: 9 %
Neutrophils Absolute: 2.8 10*3/uL (ref 1.4–7.0)
Neutrophils: 55 %
Platelets: 265 10*3/uL (ref 150–450)
RBC: 4.2 x10E6/uL (ref 3.77–5.28)
RDW: 12.6 % (ref 11.7–15.4)
WBC: 5 10*3/uL (ref 3.4–10.8)

## 2024-04-29 LAB — HIV ANTIBODY (ROUTINE TESTING W REFLEX): HIV Screen 4th Generation wRfx: NONREACTIVE

## 2024-04-29 LAB — LIPID PANEL
Chol/HDL Ratio: 1.6 ratio (ref 0.0–4.4)
Cholesterol, Total: 219 mg/dL — ABNORMAL HIGH (ref 100–199)
HDL: 133 mg/dL (ref >39)
LDL Chol Calc (NIH): 75 mg/dL (ref 0–99)
Triglycerides: 63 mg/dL (ref 0–149)
VLDL Cholesterol Cal: 11 mg/dL (ref 5–40)

## 2024-04-29 LAB — HCV AB W REFLEX TO QUANT PCR: HCV Ab: NONREACTIVE

## 2024-04-29 LAB — TSH: TSH: 1.81 u[IU]/mL (ref 0.450–4.500)

## 2024-04-29 LAB — HCV INTERPRETATION

## 2024-04-30 ENCOUNTER — Telehealth: Payer: Self-pay

## 2024-04-30 ENCOUNTER — Ambulatory Visit: Payer: Self-pay | Admitting: Family Medicine

## 2024-04-30 NOTE — Telephone Encounter (Signed)
See result encounter

## 2024-04-30 NOTE — Telephone Encounter (Signed)
 Copied from CRM [id number]. Topic: Clinical - Lab/Test Results >> Apr 30, 2024 12:51 PM Felizardo Hotter wrote: Reason for CRM: Pt returning call regarding lab results which were provided. Pt wants to know details on  elevated cholesterol. Please contact pt via MyChart.

## 2024-06-17 ENCOUNTER — Other Ambulatory Visit: Payer: Self-pay | Admitting: Family Medicine

## 2024-06-17 DIAGNOSIS — E7800 Pure hypercholesterolemia, unspecified: Secondary | ICD-10-CM

## 2024-10-29 ENCOUNTER — Encounter: Payer: Self-pay | Admitting: Family Medicine

## 2024-12-13 ENCOUNTER — Other Ambulatory Visit: Payer: Self-pay | Admitting: Family Medicine

## 2024-12-13 DIAGNOSIS — E7800 Pure hypercholesterolemia, unspecified: Secondary | ICD-10-CM

## 2025-01-19 ENCOUNTER — Encounter: Admitting: Family Medicine
